# Patient Record
Sex: Female | Born: 1985 | Hispanic: Yes | Marital: Married | Smoking: Current some day smoker
Health system: Southern US, Community
[De-identification: ages and names within clinical notes are randomized; demographics above are authoritative.]

## PROBLEM LIST (undated history)

## (undated) ENCOUNTER — Inpatient Hospital Stay (HOSPITAL_COMMUNITY): Payer: Self-pay

## (undated) DIAGNOSIS — O24419 Gestational diabetes mellitus in pregnancy, unspecified control: Secondary | ICD-10-CM

## (undated) DIAGNOSIS — D649 Anemia, unspecified: Secondary | ICD-10-CM

## (undated) DIAGNOSIS — F419 Anxiety disorder, unspecified: Secondary | ICD-10-CM

## (undated) HISTORY — DX: Anemia, unspecified: D64.9

## (undated) HISTORY — PX: APPENDECTOMY: SHX54

---

## 2007-03-14 ENCOUNTER — Emergency Department: Payer: Self-pay | Admitting: Emergency Medicine

## 2011-02-06 ENCOUNTER — Emergency Department (HOSPITAL_COMMUNITY)
Admission: EM | Admit: 2011-02-06 | Discharge: 2011-02-06 | Disposition: A | Discharge status: Home / Self Care | Payer: Self-pay | Attending: Emergency Medicine | Admitting: Emergency Medicine

## 2011-02-06 ENCOUNTER — Emergency Department (HOSPITAL_COMMUNITY): Payer: Self-pay

## 2011-02-06 DIAGNOSIS — R51 Headache: Secondary | ICD-10-CM | POA: Insufficient documentation

## 2011-03-10 ENCOUNTER — Emergency Department (HOSPITAL_COMMUNITY)
Admission: EM | Admit: 2011-03-10 | Discharge: 2011-03-11 | Disposition: A | Discharge status: Home / Self Care | Payer: Self-pay | Attending: Emergency Medicine | Admitting: Emergency Medicine

## 2011-03-10 DIAGNOSIS — M545 Low back pain, unspecified: Secondary | ICD-10-CM | POA: Insufficient documentation

## 2011-03-10 DIAGNOSIS — L089 Local infection of the skin and subcutaneous tissue, unspecified: Secondary | ICD-10-CM | POA: Insufficient documentation

## 2011-03-10 DIAGNOSIS — G8929 Other chronic pain: Secondary | ICD-10-CM | POA: Insufficient documentation

## 2011-06-25 LAB — CBC AND DIFFERENTIAL
HCT: 40 % (ref 36–46)
Hemoglobin: 13.6 g/dL (ref 12.0–16.0)
Platelets: 253 10*3/uL (ref 150–399)
WBC: 5.7 10^3/mL

## 2011-06-25 LAB — TSH: TSH: 0.67 u[IU]/mL (ref 0.41–5.90)

## 2011-07-30 ENCOUNTER — Ambulatory Visit: Payer: Self-pay | Admitting: Internal Medicine

## 2011-07-30 DIAGNOSIS — Z0289 Encounter for other administrative examinations: Secondary | ICD-10-CM

## 2011-08-17 ENCOUNTER — Encounter (HOSPITAL_COMMUNITY): Payer: Self-pay | Admitting: Emergency Medicine

## 2011-08-17 ENCOUNTER — Emergency Department (HOSPITAL_COMMUNITY)
Admission: EM | Admit: 2011-08-17 | Discharge: 2011-08-17 | Disposition: A | Discharge status: Home / Self Care | Payer: Self-pay | Attending: Emergency Medicine | Admitting: Emergency Medicine

## 2011-08-17 DIAGNOSIS — IMO0001 Reserved for inherently not codable concepts without codable children: Secondary | ICD-10-CM | POA: Insufficient documentation

## 2011-08-17 DIAGNOSIS — G43909 Migraine, unspecified, not intractable, without status migrainosus: Secondary | ICD-10-CM | POA: Insufficient documentation

## 2011-08-17 DIAGNOSIS — R509 Fever, unspecified: Secondary | ICD-10-CM | POA: Insufficient documentation

## 2011-08-17 DIAGNOSIS — R070 Pain in throat: Secondary | ICD-10-CM | POA: Insufficient documentation

## 2011-08-17 DIAGNOSIS — R51 Headache: Secondary | ICD-10-CM | POA: Insufficient documentation

## 2011-08-17 DIAGNOSIS — F419 Anxiety disorder, unspecified: Secondary | ICD-10-CM | POA: Insufficient documentation

## 2011-08-17 HISTORY — DX: Anxiety disorder, unspecified: F41.9

## 2011-08-17 NOTE — ED Notes (Signed)
Pt c/o body aches, HA, fever, sore throat x several days

## 2011-08-17 NOTE — ED Notes (Signed)
Name called no answer 

## 2011-08-28 ENCOUNTER — Encounter: Payer: Self-pay | Admitting: Internal Medicine

## 2012-05-02 LAB — OB RESULTS CONSOLE ABO/RH: RH Type: POSITIVE

## 2012-05-02 LAB — OB RESULTS CONSOLE RPR: RPR: NONREACTIVE

## 2012-05-02 LAB — OB RESULTS CONSOLE HGB/HCT, BLOOD: Hemoglobin: 12.3 g/dL

## 2012-05-02 LAB — OB RESULTS CONSOLE GC/CHLAMYDIA
Chlamydia: NEGATIVE
Gonorrhea: NEGATIVE

## 2012-05-02 LAB — OB RESULTS CONSOLE HIV ANTIBODY (ROUTINE TESTING): HIV: NONREACTIVE

## 2012-06-10 ENCOUNTER — Ambulatory Visit: Payer: BC Managed Care – PPO | Admitting: Obstetrics and Gynecology

## 2012-06-10 DIAGNOSIS — Z3689 Encounter for other specified antenatal screening: Secondary | ICD-10-CM

## 2012-06-14 NOTE — Progress Notes (Signed)
NOB interview completed.  Pt transferred care from [place], [place], records have been received.  Scheduled an anatomy scan and NOB work up on Thursday 06/23/12, work up w/ AVS.

## 2012-06-23 ENCOUNTER — Encounter: Payer: Self-pay | Admitting: Obstetrics and Gynecology

## 2012-06-23 ENCOUNTER — Ambulatory Visit: Payer: BC Managed Care – PPO

## 2012-06-23 ENCOUNTER — Ambulatory Visit: Payer: BC Managed Care – PPO | Admitting: Obstetrics and Gynecology

## 2012-06-23 VITALS — BP 100/60 | Wt 140.0 lb

## 2012-06-23 DIAGNOSIS — Z331 Pregnant state, incidental: Secondary | ICD-10-CM | POA: Insufficient documentation

## 2012-06-23 DIAGNOSIS — R760 Raised antibody titer: Secondary | ICD-10-CM | POA: Insufficient documentation

## 2012-06-23 DIAGNOSIS — Z3689 Encounter for other specified antenatal screening: Secondary | ICD-10-CM

## 2012-06-23 DIAGNOSIS — O26849 Uterine size-date discrepancy, unspecified trimester: Secondary | ICD-10-CM

## 2012-06-23 DIAGNOSIS — Z3490 Encounter for supervision of normal pregnancy, unspecified, unspecified trimester: Secondary | ICD-10-CM

## 2012-06-23 LAB — POCT URINALYSIS DIPSTICK
Bilirubin, UA: NEGATIVE
Blood, UA: NEGATIVE
Glucose, UA: NEGATIVE
Ketones, UA: NEGATIVE
Leukocytes, UA: NEGATIVE
Nitrite, UA: NEGATIVE
Spec Grav, UA: 1.02
Urobilinogen, UA: NEGATIVE
pH, UA: 5

## 2012-06-23 NOTE — Progress Notes (Signed)
[facility] NEW OB EXAMINATION   Ashley Curtis is a 27 y.o. female, G1P0, who presents at [redacted]w[redacted]d gestation for a new obstetrical examination. The patient began her prenatal care in [place], [place]. I reviewed her old records.  The patient says that her last menstrual period was normal, but she has a documented history of irregular menstrual cycles.  Review of her old records show that the patient had a weakly positive antibody screen. It was suggested that we repeat her testing.  The following portions of the patient's history were reviewed and updated as appropriate: allergies, current medications, past family history, past medical history, past social history, past surgical history and problem list.  OB History    Grav Para Term Preterm Abortions TAB SAB Ect Mult Living   1               Past Medical History  Diagnosis Date  . Anxiety   . Migraine   . Anemia     Past Surgical History  Procedure Date  . Appendectomy     6 yoa    Family History  Problem Relation Age of Onset  . Gestational diabetes Mother   . Seizures Brother     Social History:  reports that she quit smoking about 2 months ago. She has never used smokeless tobacco. She reports that she does not drink alcohol or use illicit drugs.  Allergies: No Known Allergies  Medications: Xanax, prenatal vitamins, Percocet, Phenergan, and Tylenol.   Objective:    BP 100/60  Wt 140 lb (63.504 kg)  LMP 02/07/2012    Weight:  Wt Readings from Last 1 Encounters:  06/23/12 140 lb (63.504 kg)          BMI: There is no height on file to calculate BMI.  General Appearance: Alert, appropriate appearance for age. No acute distress HEENT: Grossly normal Neck / Thyroid: Supple, no masses, nodes or enlargement Lungs: clear to auscultation bilaterally Back: No CVA tenderness Breast Exam: No masses or nodes.No dimpling, nipple retraction or discharge. Cardiovascular: Regular rate and rhythm. S1, S2, no  murmur Gastrointestinal: Soft, non-tender, no masses or organomegaly.                               Fundal height: 17 weeks                               Fetal heart tones audible: yes, by ultrasound  ++++++++++++++++++++++++++++++++++++++++++++++++++++++++  Pelvic Exam: External genitalia: normal general appearance Vaginal: normal without tenderness, induration or masses and relaxation: No. Very anxious. Cervix: nontender Adnexa: normal bimanual exam Uterus: gravid, nontender, 17 weeks size  ++++++++++++++++++++++++++++++++++++++++++++++++++++++++  Lymphatic Exam: Non-palpable nodes in neck, clavicular, axillary, or inguinal regions Neurologic: Normal speech, no tremor  Psychiatric: Alert and oriented, appropriate affect.  Prenatal labs: ABO, Rh: B/Positive/-- (12/09 0000) Antibody: Positive (12/09 0000) Rubella:  immune nonreactive RPR: Nonreactive (12/09 0000)  HBsAg:   nonreactive HIV: Non-reactive (12/09 0000)  GBS:   pending until the third trimester Hemoglobin equals 12.2. Platelets equal 325,000 Gonorrhea: Negative Chlamydia: Negative Varicella-zoster: Immune Pap smear: Normal cells Urine culture: Negative  Wet Prep:   Previously done:            no                     If no: Whiff:  Negative                              Clue cells:             no                              PH:                        4.5                              Yeast:                    no                              Trichomoniasis:    no  Urine analysis:     N/A  Ultrasound today: Single gestation, normal fluid, normal anatomy but not all anatomy seen, female, 16 weeks and 4 days, cervix 3.78 cm.    Assessment:   27 y.o. female G1P0 at [redacted]w[redacted]d gestation ( EDC is November 13, 2012) by: Ultrasound today. Last menstrual cycle is not reliable because of a history of irregular periods.  Weakly positive antibody screen  Anxiety   Plan:    We discussed routine pregnancy  issues:  Toxoplasmosis was reviewed.  The patient was told to avoid cat liter boxes and feces.  The patient was told to avoid predator fish including tuna because of our concerns for mercury consumption.  The patient was told to avoid soft cheeses.  The patient was told to be sure that all lunch meats are well cooked.  Genetic screening was discussed. Review of neck that visit.  Our model for pregnancy management was reviewed.  Proper diet and exercise reviewed.  Return to office in 4 weeks.  Medications include:  Prenatal vitamins  Repeat ultrasound next visit to complete anatomy scan.  Repeat antibody screen next visit.  Mylinda Latina.D.

## 2012-06-23 NOTE — Progress Notes (Signed)
[redacted]w[redacted]d LMP 02/06/2012 Last Pap smear 05/2012.

## 2012-07-11 LAB — US OB COMP + 14 WK

## 2012-07-21 ENCOUNTER — Other Ambulatory Visit: Payer: BC Managed Care – PPO

## 2012-07-21 ENCOUNTER — Encounter: Payer: BC Managed Care – PPO | Admitting: Obstetrics and Gynecology

## 2012-07-22 ENCOUNTER — Other Ambulatory Visit: Payer: Self-pay | Admitting: Obstetrics and Gynecology

## 2012-07-22 ENCOUNTER — Encounter: Payer: Self-pay | Admitting: Obstetrics and Gynecology

## 2012-07-22 ENCOUNTER — Ambulatory Visit: Payer: BC Managed Care – PPO

## 2012-07-22 ENCOUNTER — Ambulatory Visit: Payer: BC Managed Care – PPO | Admitting: Obstetrics and Gynecology

## 2012-07-22 VITALS — BP 100/60 | Wt 150.0 lb

## 2012-07-22 DIAGNOSIS — O358XX0 Maternal care for other (suspected) fetal abnormality and damage, not applicable or unspecified: Secondary | ICD-10-CM

## 2012-07-22 DIAGNOSIS — Z331 Pregnant state, incidental: Secondary | ICD-10-CM

## 2012-07-22 DIAGNOSIS — Z3689 Encounter for other specified antenatal screening: Secondary | ICD-10-CM

## 2012-07-22 NOTE — Progress Notes (Signed)
[redacted]w[redacted]d [place] for anatomy  422 grams 56% Breech presentation Posterior placenta FLPK 5.0 cm Normal anatomy Positive antibody screen no ID or titer. Will redraw today QUAd screen today per pt request

## 2012-07-22 NOTE — Progress Notes (Signed)
Pt declines flu shot at this time.

## 2012-07-22 NOTE — Addendum Note (Signed)
Addended by: Rolla Plate on: 07/22/2012 02:22 PM   Modules accepted: Orders

## 2012-07-25 LAB — US OB FOLLOW UP

## 2012-07-25 LAB — AFP, QUAD SCREEN
Curr Gest Age: 20.5 wks.days
Down Syndrome Scr Risk Est: <1:38500 {titer}
INH: 137.1 pg/mL
Interpretation-AFP: NEGATIVE
MoM for INH: 0.74
MoM for hCG: 1.48
Osb Risk: 1:3270 {titer}
Tri 18 Scr Risk Est: NEGATIVE
Trisomy 18 (Edward) Syndrome Interp.: 1:141000 {titer}
uE3 Mom: 1.16
uE3 Value: 1.7 ng/mL

## 2012-08-19 ENCOUNTER — Other Ambulatory Visit: Payer: Self-pay | Admitting: Family Medicine

## 2012-08-20 LAB — ANTIBODY SCREEN: Antibody Screen: NEGATIVE

## 2012-11-02 ENCOUNTER — Encounter (HOSPITAL_COMMUNITY): Payer: Self-pay | Admitting: *Deleted

## 2012-11-02 ENCOUNTER — Inpatient Hospital Stay (HOSPITAL_COMMUNITY)
Admission: AD | Admit: 2012-11-02 | Discharge: 2012-11-02 | Disposition: A | Discharge status: Home / Self Care | Payer: Self-pay | Source: Ambulatory Visit | Attending: Obstetrics and Gynecology | Admitting: Obstetrics and Gynecology

## 2012-11-02 DIAGNOSIS — R109 Unspecified abdominal pain: Secondary | ICD-10-CM | POA: Insufficient documentation

## 2012-11-02 DIAGNOSIS — B9689 Other specified bacterial agents as the cause of diseases classified elsewhere: Secondary | ICD-10-CM | POA: Insufficient documentation

## 2012-11-02 DIAGNOSIS — N760 Acute vaginitis: Secondary | ICD-10-CM | POA: Insufficient documentation

## 2012-11-02 DIAGNOSIS — O2390 Unspecified genitourinary tract infection in pregnancy, unspecified trimester: Secondary | ICD-10-CM | POA: Insufficient documentation

## 2012-11-02 DIAGNOSIS — A499 Bacterial infection, unspecified: Secondary | ICD-10-CM | POA: Insufficient documentation

## 2012-11-02 LAB — URINE MICROSCOPIC-ADD ON

## 2012-11-02 LAB — URINALYSIS, ROUTINE W REFLEX MICROSCOPIC
Glucose, UA: NEGATIVE mg/dL
Nitrite: NEGATIVE
Specific Gravity, Urine: <1.005 — ABNORMAL LOW (ref 1.005–1.030)
pH: 6.5 (ref 5.0–8.0)

## 2012-11-02 LAB — AMNISURE RUPTURE OF MEMBRANE (ROM) NOT AT ARMC: Amnisure ROM: NEGATIVE

## 2012-11-02 LAB — WET PREP, GENITAL: Trich, Wet Prep: NONE SEEN

## 2012-11-02 MED ORDER — METRONIDAZOLE 500 MG PO TABS: 500.0000 mg | ORAL_TABLET | Freq: Three times a day (TID) | ORAL | Status: DC

## 2012-11-02 NOTE — MAU Note (Signed)
Pt presents with complaints of abdominal cramping, states it feels like her period cramps. States baby is active. Denies any bleeding or LOF

## 2012-11-15 ENCOUNTER — Encounter (HOSPITAL_COMMUNITY): Payer: Self-pay | Admitting: *Deleted

## 2012-11-15 ENCOUNTER — Inpatient Hospital Stay (HOSPITAL_COMMUNITY)
Admission: AD | Admit: 2012-11-15 | Discharge: 2012-11-15 | Disposition: A | Discharge status: Home / Self Care | Payer: BC Managed Care – PPO | Source: Ambulatory Visit | Attending: Obstetrics and Gynecology | Admitting: Obstetrics and Gynecology

## 2012-11-15 DIAGNOSIS — O26853 Spotting complicating pregnancy, third trimester: Secondary | ICD-10-CM

## 2012-11-15 DIAGNOSIS — O479 False labor, unspecified: Secondary | ICD-10-CM

## 2012-11-15 NOTE — MAU Note (Signed)
Pt states she has been having contractions for about 3 wks , pt states she is also feeling sharp pains

## 2012-11-15 NOTE — MAU Note (Signed)
PT SAYS AT 0330- SHE WENT TO B-ROOM- AND SAW REDDISH- ON TOILET PAPER-  NONE ON UNDERWEAR.     THEN SHE WENT  AGAIN AT  10 AM- AND SAW  LIGHT PINK SPOT ON TOILET PAPER.   FEELS CRAMPS-   LIKE CYCLE- - LAST NIGHT  AND  TODAY - SAME.      HERE IN MAU-  SAID VE - 50 % THINNER.     DENIES HSV AND MRSA.

## 2012-11-15 NOTE — MAU Note (Signed)
Pt states she has been having contractions

## 2012-12-01 ENCOUNTER — Encounter (HOSPITAL_COMMUNITY): Payer: Self-pay

## 2012-12-02 ENCOUNTER — Encounter (HOSPITAL_COMMUNITY): Payer: Self-pay

## 2012-12-02 ENCOUNTER — Encounter (HOSPITAL_COMMUNITY)
Admission: RE | Admit: 2012-12-02 | Discharge: 2012-12-02 | Disposition: A | Discharge status: Home / Self Care | Payer: BC Managed Care – PPO | Source: Ambulatory Visit | Attending: Obstetrics and Gynecology | Admitting: Obstetrics and Gynecology

## 2012-12-02 ENCOUNTER — Other Ambulatory Visit: Payer: Self-pay | Admitting: Obstetrics and Gynecology

## 2012-12-02 LAB — CBC
MCH: 23.5 pg — ABNORMAL LOW (ref 26.0–34.0)
MCHC: 31.8 g/dL (ref 30.0–36.0)
Platelets: 274 10*3/uL (ref 150–400)
RDW: 14.8 % (ref 11.5–15.5)

## 2012-12-02 NOTE — Patient Instructions (Addendum)
19 Ashley Curtis  12/02/2012   Your procedure is scheduled on:  12/05/12  Enter through the Main Entrance of [facility] at 745 AM.  Pick up the phone at the desk and dial [phone].   Call this number if you have problems the morning of surgery: [phone]   Remember:   Do not eat food:After Midnight.  Do not drink clear liquids: After Midnight.  Take these medicines the morning of surgery with A SIP OF WATER: Zantac   Do not wear jewelry, make-up or nail polish.  Do not wear lotions, powders, or perfumes. You may wear deodorant.  Do not shave 48 hours prior to surgery.  Do not bring valuables to the hospital.  [facility] is not responsible                  for any belongings or valuables brought to the hospital.  Contacts, dentures or bridgework may not be worn into surgery.  Leave suitcase in the car. After surgery it may be brought to your room.  For patients admitted to the hospital, checkout time is 11:00 AM the day of                discharge.   Patients discharged the day of surgery will not be allowed to drive                   home.  Name and phone number of your driver: NA  Special Instructions: Shower using CHG 2 nights before surgery and the night before surgery.  If you shower the day of surgery use CHG.  Use special wash - you have one bottle of CHG for all showers.  You should use approximately 1/3 of the bottle for each shower.   Please read over the following fact sheets that you were given: Surgical Site Infection Prevention

## 2012-12-03 ENCOUNTER — Encounter (HOSPITAL_COMMUNITY): Payer: Self-pay | Admitting: Pharmacy Technician

## 2012-12-03 LAB — RPR: RPR Ser Ql: NONREACTIVE

## 2012-12-04 ENCOUNTER — Encounter (HOSPITAL_COMMUNITY): Payer: Self-pay | Admitting: *Deleted

## 2012-12-04 ENCOUNTER — Encounter (HOSPITAL_COMMUNITY)
Admission: AD | Disposition: A | Discharge status: Home / Self Care | Payer: Self-pay | Source: Ambulatory Visit | Attending: Obstetrics and Gynecology

## 2012-12-04 ENCOUNTER — Encounter (HOSPITAL_COMMUNITY): Payer: Self-pay | Admitting: Anesthesiology

## 2012-12-04 ENCOUNTER — Inpatient Hospital Stay (HOSPITAL_COMMUNITY)
Admission: AD | Admit: 2012-12-04 | Discharge: 2012-12-06 | DRG: 370 | Disposition: A | Discharge status: Home / Self Care | Payer: BC Managed Care – PPO | Source: Ambulatory Visit | Attending: Obstetrics and Gynecology | Admitting: Obstetrics and Gynecology

## 2012-12-04 ENCOUNTER — Inpatient Hospital Stay (HOSPITAL_COMMUNITY): Payer: BC Managed Care – PPO | Admitting: Anesthesiology

## 2012-12-04 DIAGNOSIS — O24419 Gestational diabetes mellitus in pregnancy, unspecified control: Secondary | ICD-10-CM

## 2012-12-04 DIAGNOSIS — O3660X0 Maternal care for excessive fetal growth, unspecified trimester, not applicable or unspecified: Principal | ICD-10-CM | POA: Diagnosis present

## 2012-12-04 DIAGNOSIS — D649 Anemia, unspecified: Secondary | ICD-10-CM | POA: Diagnosis not present

## 2012-12-04 DIAGNOSIS — Z98891 History of uterine scar from previous surgery: Secondary | ICD-10-CM

## 2012-12-04 DIAGNOSIS — O9903 Anemia complicating the puerperium: Secondary | ICD-10-CM | POA: Diagnosis not present

## 2012-12-04 LAB — HEPATITIS B SURFACE ANTIGEN: Hepatitis B Surface Ag: NEGATIVE

## 2012-12-04 SURGERY — Surgical Case
Anesthesia: Spinal | Site: Abdomen | Wound class: Clean Contaminated

## 2012-12-04 MED ORDER — KETOROLAC TROMETHAMINE 60 MG/2ML IM SOLN
INTRAMUSCULAR | Status: AC
  Administered 2012-12-04: 60 mg via INTRAMUSCULAR
  Filled 2012-12-04: qty 2

## 2012-12-04 MED ORDER — PROMETHAZINE HCL 25 MG/ML IJ SOLN
6.2500 mg | INTRAMUSCULAR | Status: DC | PRN
Start: 2012-12-04 — End: 2012-12-05

## 2012-12-04 MED ORDER — ONDANSETRON HCL 4 MG/2ML IJ SOLN
INTRAMUSCULAR | Status: AC
  Filled 2012-12-04: qty 2

## 2012-12-04 MED ORDER — FENTANYL CITRATE 0.05 MG/ML IJ SOLN
INTRAMUSCULAR | Status: AC
  Filled 2012-12-04: qty 2

## 2012-12-04 MED ORDER — PROMETHAZINE HCL 25 MG/ML IJ SOLN
INTRAMUSCULAR | Status: AC
  Administered 2012-12-04: 6.25 mg via INTRAVENOUS
  Filled 2012-12-04: qty 1

## 2012-12-04 MED ORDER — OXYTOCIN 10 UNIT/ML IJ SOLN
INTRAMUSCULAR | Status: AC
  Filled 2012-12-04: qty 4

## 2012-12-04 MED ORDER — MORPHINE SULFATE 0.5 MG/ML IJ SOLN
INTRAMUSCULAR | Status: AC
  Filled 2012-12-04: qty 10

## 2012-12-04 MED ORDER — SCOPOLAMINE 1 MG/3DAYS TD PT72: 1.0000 | MEDICATED_PATCH | Freq: Once | TRANSDERMAL | Status: DC

## 2012-12-04 MED ORDER — FAMOTIDINE IN NACL 20-0.9 MG/50ML-% IV SOLN
20.0000 mg | Freq: Once | INTRAVENOUS | Status: AC
  Administered 2012-12-04: 20 mg via INTRAVENOUS

## 2012-12-04 MED ORDER — FENTANYL CITRATE 0.05 MG/ML IJ SOLN
INTRAMUSCULAR | Status: DC | PRN
  Administered 2012-12-04: 12.5 ug via INTRATHECAL

## 2012-12-04 MED ORDER — LACTATED RINGERS IV SOLN
INTRAVENOUS | Status: DC | PRN
  Administered 2012-12-04: 22:00:00 via INTRAVENOUS

## 2012-12-04 MED ORDER — MORPHINE SULFATE (PF) 0.5 MG/ML IJ SOLN
INTRAMUSCULAR | Status: DC | PRN
  Administered 2012-12-04: .1 mg via EPIDURAL

## 2012-12-04 MED ORDER — MEPERIDINE HCL 25 MG/ML IJ SOLN: 6.2500 mg | INTRAMUSCULAR | Status: DC | PRN

## 2012-12-04 MED ORDER — KETOROLAC TROMETHAMINE 60 MG/2ML IM SOLN: 60.0000 mg | Freq: Once | INTRAMUSCULAR | Status: AC | PRN

## 2012-12-04 MED ORDER — FENTANYL CITRATE 0.05 MG/ML IJ SOLN: 25.0000 ug | INTRAMUSCULAR | Status: DC | PRN

## 2012-12-04 MED ORDER — SCOPOLAMINE 1 MG/3DAYS TD PT72
MEDICATED_PATCH | TRANSDERMAL | Status: AC
  Administered 2012-12-04: 1.5 mg via TRANSDERMAL
  Filled 2012-12-04: qty 1

## 2012-12-04 MED ORDER — CITRIC ACID-SODIUM CITRATE 334-500 MG/5ML PO SOLN
ORAL | Status: AC
  Filled 2012-12-04: qty 15

## 2012-12-04 MED ORDER — BUPIVACAINE HCL (PF) 0.25 % IJ SOLN
INTRAMUSCULAR | Status: AC
  Filled 2012-12-04: qty 30

## 2012-12-04 MED ORDER — CEFAZOLIN SODIUM-DEXTROSE 2-3 GM-% IV SOLR
2.0000 g | INTRAVENOUS | Status: AC
  Administered 2012-12-04: 2 g via INTRAVENOUS
  Filled 2012-12-04: qty 50

## 2012-12-04 MED ORDER — 0.9 % SODIUM CHLORIDE (POUR BTL) OPTIME
TOPICAL | Status: DC | PRN
  Administered 2012-12-04: 500 mL

## 2012-12-04 MED ORDER — BUPIVACAINE IN DEXTROSE 0.75-8.25 % IT SOLN
INTRATHECAL | Status: DC | PRN
  Administered 2012-12-04: 1.5 mg via INTRATHECAL

## 2012-12-04 MED ORDER — LACTATED RINGERS IV SOLN
INTRAVENOUS | Status: DC
  Administered 2012-12-04 (×4): via INTRAVENOUS

## 2012-12-04 MED ORDER — CITRIC ACID-SODIUM CITRATE 334-500 MG/5ML PO SOLN
30.0000 mL | Freq: Once | ORAL | Status: AC
  Administered 2012-12-04: 30 mL via ORAL

## 2012-12-04 MED ORDER — ONDANSETRON HCL 4 MG/2ML IJ SOLN
INTRAMUSCULAR | Status: DC | PRN
  Administered 2012-12-04: 4 mg via INTRAVENOUS

## 2012-12-04 MED ORDER — BUPIVACAINE HCL (PF) 0.25 % IJ SOLN
INTRAMUSCULAR | Status: DC | PRN
  Administered 2012-12-04: 20 mL

## 2012-12-04 MED ORDER — FAMOTIDINE IN NACL 20-0.9 MG/50ML-% IV SOLN
INTRAVENOUS | Status: AC
  Filled 2012-12-04: qty 50

## 2012-12-04 MED ORDER — LACTATED RINGERS IV SOLN: INTRAVENOUS | Status: DC

## 2012-12-04 SURGICAL SUPPLY — 35 items
BENZOIN TINCTURE PRP APPL 2/3 (GAUZE/BANDAGES/DRESSINGS) ×2 IMPLANT
BOOTIES KNEE HIGH SLOAN (MISCELLANEOUS) ×4 IMPLANT
CLAMP CORD UMBIL (MISCELLANEOUS) IMPLANT
CLOTH BEACON ORANGE TIMEOUT ST (SAFETY) ×2 IMPLANT
DRAIN JACKSON PRT FLT 10 (DRAIN) IMPLANT
DRAPE LG THREE QUARTER DISP (DRAPES) ×2 IMPLANT
DRSG OPSITE POSTOP 4X10 (GAUZE/BANDAGES/DRESSINGS) ×2 IMPLANT
DURAPREP 26ML APPLICATOR (WOUND CARE) ×2 IMPLANT
ELECT REM PT RETURN 9FT ADLT (ELECTROSURGICAL) ×2
ELECTRODE REM PT RTRN 9FT ADLT (ELECTROSURGICAL) ×1 IMPLANT
EVACUATOR SILICONE 100CC (DRAIN) IMPLANT
EXTRACTOR VACUUM M CUP 4 TUBE (SUCTIONS) IMPLANT
GLOVE BIOGEL PI IND STRL 7.0 (GLOVE) ×1 IMPLANT
GLOVE BIOGEL PI INDICATOR 7.0 (GLOVE) ×1
GLOVE ECLIPSE 6.5 STRL STRAW (GLOVE) ×2 IMPLANT
GLOVE SURG SS PI 7.5 STRL IVOR (GLOVE) ×8 IMPLANT
GOWN STRL REIN XL XLG (GOWN DISPOSABLE) ×10 IMPLANT
KIT ABG SYR 3ML LUER SLIP (SYRINGE) IMPLANT
NEEDLE HYPO 22GX1.5 SAFETY (NEEDLE) ×2 IMPLANT
NEEDLE HYPO 25X5/8 SAFETYGLIDE (NEEDLE) IMPLANT
NS IRRIG 1000ML POUR BTL (IV SOLUTION) ×4 IMPLANT
PACK C SECTION WH (CUSTOM PROCEDURE TRAY) ×2 IMPLANT
PAD OB MATERNITY 4.3X12.25 (PERSONAL CARE ITEMS) ×2 IMPLANT
RTRCTR C-SECT PINK 25CM LRG (MISCELLANEOUS) ×2 IMPLANT
STRIP CLOSURE SKIN 1/2X4 (GAUZE/BANDAGES/DRESSINGS) ×2 IMPLANT
SUT CHROMIC GUT AB #0 18 (SUTURE) IMPLANT
SUT MNCRL AB 3-0 PS2 27 (SUTURE) ×2 IMPLANT
SUT SILK 2 0 FSL 18 (SUTURE) IMPLANT
SUT VIC AB 0 CTX 36 (SUTURE) ×2
SUT VIC AB 0 CTX36XBRD ANBCTRL (SUTURE) ×2 IMPLANT
SUT VIC AB 1 CT1 36 (SUTURE) ×4 IMPLANT
SYR 20CC LL (SYRINGE) ×2 IMPLANT
TOWEL OR 17X24 6PK STRL BLUE (TOWEL DISPOSABLE) ×6 IMPLANT
TRAY FOLEY CATH 14FR (SET/KITS/TRAYS/PACK) ×2 IMPLANT
WATER STERILE IRR 1000ML POUR (IV SOLUTION) ×2 IMPLANT

## 2012-12-04 SURGICAL SUPPLY — 34 items
BENZOIN TINCTURE PRP APPL 2/3 (GAUZE/BANDAGES/DRESSINGS) IMPLANT
CLAMP CORD UMBIL (MISCELLANEOUS) IMPLANT
CLOTH BEACON ORANGE TIMEOUT ST (SAFETY) IMPLANT
CONTAINER PREFILL 10% NBF 15ML (MISCELLANEOUS) IMPLANT
DRAPE LG THREE QUARTER DISP (DRAPES) IMPLANT
DRSG OPSITE POSTOP 4X10 (GAUZE/BANDAGES/DRESSINGS) IMPLANT
DURAPREP 26ML APPLICATOR (WOUND CARE) IMPLANT
ELECT REM PT RETURN 9FT ADLT (ELECTROSURGICAL)
ELECTRODE REM PT RTRN 9FT ADLT (ELECTROSURGICAL) IMPLANT
EXTRACTOR VACUUM M CUP 4 TUBE (SUCTIONS) IMPLANT
GLOVE BIO SURGEON STRL SZ7.5 (GLOVE) IMPLANT
GLOVE BIOGEL PI IND STRL 7.5 (GLOVE) IMPLANT
GLOVE BIOGEL PI INDICATOR 7.5 (GLOVE)
GLOVE SURG SS PI 6.5 STRL IVOR (GLOVE) IMPLANT
GLOVE SURG SS PI 7.0 STRL IVOR (GLOVE) IMPLANT
GOWN STRL REIN XL XLG (GOWN DISPOSABLE) IMPLANT
KIT ABG SYR 3ML LUER SLIP (SYRINGE) IMPLANT
NEEDLE HYPO 25X5/8 SAFETYGLIDE (NEEDLE) IMPLANT
NS IRRIG 1000ML POUR BTL (IV SOLUTION) IMPLANT
PACK C SECTION WH (CUSTOM PROCEDURE TRAY) IMPLANT
PAD OB MATERNITY 4.3X12.25 (PERSONAL CARE ITEMS) IMPLANT
RETRACTOR WND ALEXIS 25 LRG (MISCELLANEOUS) IMPLANT
RTRCTR WOUND ALEXIS 25CM LRG (MISCELLANEOUS)
STRIP CLOSURE SKIN 1/2X4 (GAUZE/BANDAGES/DRESSINGS) IMPLANT
SUT CHROMIC 2 0 CT 1 (SUTURE) IMPLANT
SUT MNCRL AB 3-0 PS2 27 (SUTURE) IMPLANT
SUT PLAIN 0 NONE (SUTURE) IMPLANT
SUT PLAIN 2 0 XLH (SUTURE) IMPLANT
SUT VIC AB 0 CT1 36 (SUTURE) IMPLANT
SUT VIC AB 0 CTX 36 (SUTURE)
SUT VIC AB 0 CTX36XBRD ANBCTRL (SUTURE) IMPLANT
TOWEL OR 17X24 6PK STRL BLUE (TOWEL DISPOSABLE) IMPLANT
TRAY FOLEY CATH 14FR (SET/KITS/TRAYS/PACK) IMPLANT
WATER STERILE IRR 1000ML POUR (IV SOLUTION) IMPLANT

## 2012-12-04 NOTE — Anesthesia Procedure Notes (Signed)
Spinal  Patient location during procedure: OR Staffing Anesthesiologist: Phillips Grout Performed by: anesthesiologist  Preanesthetic Checklist Completed: patient identified, site marked, surgical consent, pre-op evaluation, timeout performed, IV checked, risks and benefits discussed and monitors and equipment checked Spinal Block Patient position: sitting Prep: ChloraPrep Patient monitoring: heart rate, continuous pulse ox and blood pressure Approach: midline Location: L3-4 Injection technique: single-shot Needle Needle type: Spinocan  Needle gauge: 22 G Needle length: 9 cm Additional Notes Expiration date of kit checked and confirmed. Patient tolerated procedure well, without complications.

## 2012-12-04 NOTE — Anesthesia Preprocedure Evaluation (Signed)
Anesthesia Evaluation  Patient identified by MRN, date of birth, ID band Patient awake    Reviewed: Allergy & Precautions, H&P , NPO status , Patient's Chart, lab work & pertinent test results  Airway Mallampati: II TM Distance: >3 FB Neck ROM: Full    Dental no notable dental hx.    Pulmonary neg pulmonary ROS,  breath sounds clear to auscultation  Pulmonary exam normal       Cardiovascular negative cardio ROS  Rhythm:Regular Rate:Normal     Neuro/Psych negative neurological ROS  negative psych ROS   GI/Hepatic negative GI ROS, Neg liver ROS,   Endo/Other  negative endocrine ROS  Renal/GU negative Renal ROS  negative genitourinary   Musculoskeletal negative musculoskeletal ROS (+)   Abdominal   Peds negative pediatric ROS (+)  Hematology negative hematology ROS (+)   Anesthesia Other Findings   Reproductive/Obstetrics negative OB ROS (+) Pregnancy                           Anesthesia Physical Anesthesia Plan  ASA: II and emergent  Anesthesia Plan: Spinal   Post-op Pain Management:    Induction:   Airway Management Planned:   Additional Equipment:   Intra-op Plan:   Post-operative Plan:   Informed Consent: I have reviewed the patients History and Physical, chart, labs and discussed the procedure including the risks, benefits and alternatives for the proposed anesthesia with the patient or authorized representative who has indicated his/her understanding and acceptance.   Dental advisory given  Plan Discussed with: CRNA  Anesthesia Plan Comments:         Anesthesia Quick Evaluation  

## 2012-12-04 NOTE — Op Note (Signed)
Preoperative diagnosis: Intrauterine pregnancy at 40 weeks with suspected macrosomia. Patient desires elective cesarean section  Post operative diagnosis: Same  Anesthesia: Spinal  Anesthesiologist: Dr. Willa Frater  Procedure: Primary low transverse cesarean section  Surgeon: Dr. Dois Davenport Javon Snee  Assistant: Sanda Klein CNM  Estimated blood loss:600 cc  Procedure:  After being informed of the planned procedure and possible complications including bleeding, infection, injury to other organs, informed consent is obtained. The patient is taken to OR #9 and given spinal anesthesia without complication. She is placed in the dorsal decubitus position with the pelvis tilted to the left. She is then prepped and draped in a sterile fashion. A Foley catheter is inserted in her bladder.  After assessing adequate level of anesthesia, we infiltrate the suprapubic area with 20 cc of Marcaine 0.25 and perform a Pfannenstiel incision which is brought down sharply to the fascia. The fascia is entered in a low transverse fashion. Linea alba is dissected. Peritoneum is entered in a midline fashion. An Alexis retractor is easily positioned.   The myometrium is then entered in a low transverse fashion, 2 cm above the vesico-uterine junction ; first with knife and then extended bluntly. Amniotic fluid is clear. We assist the birth of a female  infant in vertex presentation. Mouth and nose are suctioned. The baby is delivered. The cord is clamped and sectioned. The baby is given to the neonatologist present in the room.  10 cc of blood is drawn from the umbilical vein.The placenta is allowed to deliver spontaneously. It is complete and the cord has 3 vessels. Uterine revision is negative.  We proceed with closure of the myometrium in 2 layers: First with a running locked suture of 0 Vicryl, then with a Lembert suture of 0 Vicryl imbricating the first one. Hemostasis is completed with cauterization on peritoneal  edges.  Both paracolic gutters are cleaned. Both tubes and ovaries are assessed and normal. The pelvis is profusely irrigated with warm saline to confirm a satisfactory hemostasis.  Retractors and sponges are removed. Under fascia hemostasis is completed with cauterization. The fascia is then closed with 2 running sutures of 0 Vicryl meeting midline. The wound is irrigated with warm saline and hemostasis is completed with cauterization. The skin is closed with a subcuticular suture of 3-0 Monocryl and Steri-Strips.  Instrument and sponge count is complete x2. Estimated blood loss is 600 cc.  The procedure is well tolerated by the patient who is taken to recovery room in a well and stable condition.  female baby named [name] was born at 21:43 and received an Apgar of 9  at 1 minute and 9 at 5 minutes.    Specimen: Placenta sent to L & D   Ciro Tashiro A MD 7/13/201410:17 PM

## 2012-12-04 NOTE — MAU Provider Note (Signed)
History   CSN: [id number]  Arrival date and time: 11/02/12 4098    Chief Complaint  Patient presents with  . Abdominal Pain  . Vaginal Discharge   HPI Pt presents with c/o intermittent cramping, abdominal pain and increased vaginal discharge.  She reports normal fetal movement.  She denies itching, burning but has noted some odor with vaginal discharge.  Reports cramping approx 1-2x/hr.  Denies bldg.   OB History   Grav Para Term Preterm Abortions TAB SAB Ect Mult Living   1               Past Medical History  Diagnosis Date  . Anxiety   . Migraine   . Anemia     Past Surgical History  Procedure Laterality Date  . Appendectomy      6 yoa    Family History  Problem Relation Age of Onset  . Gestational diabetes Mother   . Seizures Brother     History  Substance Use Topics  . Smoking status: Former Smoker    Quit date: 03/25/2012  . Smokeless tobacco: Never Used  . Alcohol Use: No     Comment: Occasionally prior to pregnancy    Allergies: No Known Allergies  No prescriptions prior to admission    Review of Systems  Constitutional: Negative.   HENT: Negative.   Eyes: Negative.   Respiratory: Negative.   Cardiovascular: Negative.   Gastrointestinal: Negative.   Genitourinary: Negative.   Musculoskeletal: Negative.   Skin: Negative.   Neurological: Negative.   Endo/Heme/Allergies: Negative.   Psychiatric/Behavioral: Negative.    Physical Exam   Blood pressure 119/77, pulse 79, temperature 97.6 F (36.4 C), temperature source Oral, resp. rate 18, height 5\' 5"  (1.651 m), weight 81.194 kg (179 lb), last menstrual period 02/07/2012.  Physical Exam  Constitutional: She is oriented to person, place, and time. She appears well-developed and well-nourished.  HENT:  Head: Normocephalic and atraumatic.  Right Ear: External ear normal.  Left Ear: External ear normal.  Nose: Nose normal.  Eyes: Conjunctivae are normal. Pupils are equal, round, and reactive  to light.  Neck: Normal range of motion. Neck supple.  Cardiovascular: Normal rate, regular rhythm and intact distal pulses.   Respiratory: Effort normal and breath sounds normal.  GI: Soft. Bowel sounds are normal. There is no tenderness. There is no rebound and no guarding.  Genitourinary: Uterus normal. Vaginal discharge found.  Ext gent WNL.  BUS neg.  Mod amt white creamy discharge in vault.  Cx without lesions or discharge.  Vault without redness.  SVE: Closed/thick/-3  Musculoskeletal: Normal range of motion.  Neurological: She is alert and oriented to person, place, and time. She has normal reflexes.  Skin: Skin is warm and dry.  Psychiatric: She has a normal mood and affect. Her behavior is normal.   FHR baseline: 130bpm; Variability-moderate; accels-present; decels-absent; FHR Cat 1 Toco: Irreg UCs   MAU Course  Procedures Microscopic wet-mount exam shows clue cells and WBCs Urine dipstick shows:  Ref. Range 11/02/2012 20:40  Color, Urine Latest Range: YELLOW  YELLOW  APPearance Latest Range: CLEAR  CLEAR  Specific Gravity, Urine Latest Range: 1.005-1.030  <1.005 (L)  pH Latest Range: 5.0-8.0  6.5  Glucose Latest Range: NEGATIVE mg/dL NEGATIVE  Bilirubin Urine Latest Range: NEGATIVE  NEGATIVE  Ketones, ur Latest Range: NEGATIVE mg/dL NEGATIVE  Protein Latest Range: NEGATIVE mg/dL NEGATIVE  Urobilinogen, UA Latest Range: 0.0-1.0 mg/dL 0.2  Nitrite Latest Range: NEGATIVE  NEGATIVE  Leukocytes, UA  Latest Range: NEGATIVE  TRACE (A)  Hgb urine dipstick Latest Range: NEGATIVE  SMALL (A)  WBC, UA Latest Range: <3 WBC/hpf 0-2  RBC / HPF Latest Range: <3 RBC/hpf 3-6  Squamous Epithelial / LPF Latest Range: RARE  RARE  Bacteria, UA Latest Range: RARE  FEW (A)    Assessment and Plan  IUP at 35w 3d Bacterial vaginosis  Discharge to home. Rx Metronidazole 500mg  bid x 7 days with instructions reviewed. Revd fetal kick counts/s/s labor. RTO as sched for f/u in 2 days.    Delorice Bannister O. 12/04/2012, 12:12 AM

## 2012-12-04 NOTE — Transfer of Care (Signed)
Immediate Anesthesia Transfer of Care Note  Patient: Ashley Curtis  Procedure(s) Performed: Procedure(s): CESAREAN SECTION (N/A)  Patient Location: PACU  Anesthesia Type:Spinal  Level of Consciousness: awake, alert  and oriented  Airway & Oxygen Therapy: Patient Spontanous Breathing  Post-op Assessment: Report given to PACU RN and Post -op Vital signs reviewed and stable  Post vital signs: Reviewed and stable  Complications: No apparent anesthesia complications

## 2012-12-04 NOTE — H&P (Signed)
Ashley Curtis is a 27 y.o. female at 78wks,  presenting for onset of ctx about 430 this morning, getting stronger and more frequent, denies any VB or LOF, GFM. Pt scheduled for primary elective c/s due to LGA tmrw AM. Pt still wishes to proceed w c/s.   HPI: Pt tx care from [place] to [facility] at 16wks.  [place] at that time was normal, w EDC 7/13, EDC by LMP =6/22, Final EDC 7/13 [place] at 20wks for anatomy WNL c/w 7/13 EDC Quad screen WNL Pt had a hx of +antibody screen, repeated at 24wks negative 1hr gtt 173, at 28wks, pt was unable to tolerate 3hr gtt,  Random CBG at office was 75 GBS neg at 36wks tx'd for BV on 6/11  [place] at 38wks EFW 90% 8#12oz [place] at [redacted]w[redacted]d EFW 8#12oz Pt was counseled on R/B of vag delivery vs. Primary c/s, pt desires primary c/s VE at 38wks cervix closed   Maternal Medical History:  Reason for admission: Contractions.   Contractions: Onset was 6-12 hours ago.   Frequency: regular.   Duration is approximately 60 seconds.   Perceived severity is mild.    Fetal activity: Perceived fetal activity is normal.   Last perceived fetal movement was within the past hour.    Prenatal complications: no prenatal complications Prenatal Complications - Diabetes: Elevated 1hr gtt, no further testing   OB History   Grav Para Term Preterm Abortions TAB SAB Ect Mult Living   1              Past Medical History  Diagnosis Date  . Anxiety   . Migraine   . Anemia     Family History: family history includes Gestational diabetes in her mother and Seizures in her brother. Social History:  reports that she quit smoking about 8 months ago. She has never used smokeless tobacco. She reports that she does not drink alcohol or use illicit drugs.   Prenatal Transfer Tool  Maternal Diabetes: unknown, abnormal 1hr gtt without further testing  Genetic Screening: Normal Maternal Ultrasounds/Referrals: Normal Fetal Ultrasounds or other Referrals:  None Maternal Substance Abuse:  No Significant  Maternal Medications:  None Significant Maternal Lab Results:  Lab values include: Group B Strep negative Other Comments:  None  Review of Systems  All other systems reviewed and are negative.    Dilation: 1 Effacement (%): 50 Exam by:: Shelly Renuka Farfan CNM Blood pressure 127/79, pulse 86, temperature 97.8 F (36.6 C), temperature source Oral, resp. rate 16, height 5\' 5"  (1.651 m), weight 180 lb (81.647 kg), last menstrual period 02/07/2012. Maternal Exam:  Uterine Assessment: Contraction strength is mild.  Contraction duration is 60 seconds. Contraction frequency is regular.   Abdomen: Patient reports no abdominal tenderness. Fundal height is aga.   Estimated fetal weight is 8-12.   Fetal presentation: vertex  Introitus: Normal vulva. Normal vagina.  Pelvis: adequate for delivery.   Cervix: Cervix evaluated by digital exam.     Fetal Exam Fetal Monitor Review: Mode: ultrasound.   Baseline rate: 130.  Variability: moderate (6-25 bpm).   Pattern: accelerations present and no decelerations.    Fetal State Assessment: Category I - tracings are normal.     Physical Exam  Nursing note and vitals reviewed. Constitutional: She is oriented to person, place, and time. She appears well-developed and well-nourished.  HENT:  Head: Normocephalic.  Eyes: Pupils are equal, round, and reactive to light.  Neck: Normal range of motion.  Cardiovascular: Normal rate, regular rhythm and  normal heart sounds.   Respiratory: Effort normal and breath sounds normal.  GI: Soft. Bowel sounds are normal.  Genitourinary: Vagina normal.  Cervix initially 1/50/-1, posterior Changed to 1.5/80/-1/0, midline  Musculoskeletal: Normal range of motion.  Neurological: She is alert and oriented to person, place, and time. She has normal reflexes.  Skin: Skin is warm and dry.  Psychiatric: She has a normal mood and affect. Her behavior is normal.    Prenatal labs: ABO, Rh: --/--/B POS, B POS (07/11  1615) Antibody: NEG (07/11 1615) Rubella: Immune (12/09 0000) RPR: NON REACTIVE (07/11 1615)  HBsAg:   unavailable at time of admission HIV: Non-reactive (12/09 0000)  GBS:   neg 6/26 Pap/GC/CT neg - 1/14 TSH .67 1/31 Varicella Imm AB screen pos, repeat neg on 3/28 Quad screen WNL 1hr gtt 173, hgb 9.1, RPR NR 4/25   Assessment/Plan: IUP at 40wks Abnormal 1hr gtt,  LGA Early labor GBS neg FHR reassuring  Admit to preop per c/w Dr Su Hilt Routine pre-op orders Ancef 2g in the OR Type and cross for 2 units Consent for primary elective c/s, risks of c/s include 1. Bleeding 2. Infection 3. Anesthesia complications 4. Damage to internal organs Pt understands these risks and wishes to proceed w C/S.     Malissa Hippo 12/04/2012, 6:04 PM

## 2012-12-04 NOTE — OR Nursing (Signed)
50 ml blood loss during fundal massage by DLWegner RN 

## 2012-12-04 NOTE — MAU Note (Signed)
Pt presents with complaints of contractions that started last night and have gotten worse throughout the day. Denies any bleeding or LOF. States that she is scheduled for a cesarean section tomorrow July 14th with Dr Su Hilt.

## 2012-12-05 ENCOUNTER — Encounter (HOSPITAL_COMMUNITY): Payer: Self-pay | Admitting: Obstetrics and Gynecology

## 2012-12-05 ENCOUNTER — Encounter (HOSPITAL_COMMUNITY): Admission: RE | Payer: Self-pay | Source: Ambulatory Visit

## 2012-12-05 ENCOUNTER — Inpatient Hospital Stay (HOSPITAL_COMMUNITY)
Admission: RE | Admit: 2012-12-05 | Payer: BC Managed Care – PPO | Source: Ambulatory Visit | Admitting: Obstetrics and Gynecology

## 2012-12-05 LAB — CBC
HCT: 21.7 % — ABNORMAL LOW (ref 36.0–46.0)
Hemoglobin: 6.9 g/dL — CL (ref 12.0–15.0)
MCH: 23.5 pg — ABNORMAL LOW (ref 26.0–34.0)
MCHC: 31.8 g/dL (ref 30.0–36.0)
MCV: 74.1 fL — ABNORMAL LOW (ref 78.0–100.0)
RDW: 14.9 % (ref 11.5–15.5)

## 2012-12-05 SURGERY — Surgical Case
Anesthesia: Regional

## 2012-12-05 MED ORDER — SIMETHICONE 80 MG PO CHEW
80.0000 mg | CHEWABLE_TABLET | Freq: Three times a day (TID) | ORAL | Status: DC
  Administered 2012-12-05 – 2012-12-06 (×5): 80 mg via ORAL

## 2012-12-05 MED ORDER — DIPHENHYDRAMINE HCL 50 MG/ML IJ SOLN: 12.5000 mg | INTRAMUSCULAR | Status: DC | PRN

## 2012-12-05 MED ORDER — WITCH HAZEL-GLYCERIN EX PADS: 1.0000 "application " | MEDICATED_PAD | CUTANEOUS | Status: DC | PRN

## 2012-12-05 MED ORDER — DIPHENHYDRAMINE HCL 50 MG/ML IJ SOLN: 25.0000 mg | INTRAMUSCULAR | Status: DC | PRN

## 2012-12-05 MED ORDER — IBUPROFEN 600 MG PO TABS
600.0000 mg | ORAL_TABLET | Freq: Four times a day (QID) | ORAL | Status: DC
  Administered 2012-12-05 – 2012-12-06 (×5): 600 mg via ORAL
  Filled 2012-12-05 (×5): qty 1

## 2012-12-05 MED ORDER — TETANUS-DIPHTH-ACELL PERTUSSIS 5-2.5-18.5 LF-MCG/0.5 IM SUSP: 0.5000 mL | Freq: Once | INTRAMUSCULAR | Status: DC

## 2012-12-05 MED ORDER — SENNOSIDES-DOCUSATE SODIUM 8.6-50 MG PO TABS
2.0000 | ORAL_TABLET | Freq: Every day | ORAL | Status: DC
  Administered 2012-12-05: 2 via ORAL

## 2012-12-05 MED ORDER — SODIUM CHLORIDE 0.9 % IJ SOLN: 3.0000 mL | INTRAMUSCULAR | Status: DC | PRN

## 2012-12-05 MED ORDER — KETOROLAC TROMETHAMINE 30 MG/ML IJ SOLN: 30.0000 mg | Freq: Four times a day (QID) | INTRAMUSCULAR | Status: AC | PRN

## 2012-12-05 MED ORDER — LANOLIN HYDROUS EX OINT: 1.0000 "application " | TOPICAL_OINTMENT | CUTANEOUS | Status: DC | PRN

## 2012-12-05 MED ORDER — METOCLOPRAMIDE HCL 5 MG/ML IJ SOLN: 10.0000 mg | Freq: Three times a day (TID) | INTRAMUSCULAR | Status: DC | PRN

## 2012-12-05 MED ORDER — NALOXONE HCL 0.4 MG/ML IJ SOLN: 0.4000 mg | INTRAMUSCULAR | Status: DC | PRN

## 2012-12-05 MED ORDER — MEASLES, MUMPS & RUBELLA VAC ~~LOC~~ INJ
0.5000 mL | INJECTION | Freq: Once | SUBCUTANEOUS | Status: DC
  Filled 2012-12-05: qty 0.5

## 2012-12-05 MED ORDER — SIMETHICONE 80 MG PO CHEW: 80.0000 mg | CHEWABLE_TABLET | ORAL | Status: DC | PRN

## 2012-12-05 MED ORDER — OXYTOCIN 40 UNITS IN LACTATED RINGERS INFUSION - SIMPLE MED: 62.5000 mL/h | INTRAVENOUS | Status: AC

## 2012-12-05 MED ORDER — DIPHENHYDRAMINE HCL 25 MG PO CAPS
25.0000 mg | ORAL_CAPSULE | ORAL | Status: DC | PRN
  Administered 2012-12-05: 25 mg via ORAL
  Filled 2012-12-05: qty 1

## 2012-12-05 MED ORDER — FERROUS SULFATE 325 (65 FE) MG PO TABS
325.0000 mg | ORAL_TABLET | Freq: Two times a day (BID) | ORAL | Status: DC
  Administered 2012-12-05 – 2012-12-06 (×3): 325 mg via ORAL
  Filled 2012-12-05 (×3): qty 1

## 2012-12-05 MED ORDER — MENTHOL 3 MG MT LOZG: 1.0000 | LOZENGE | OROMUCOSAL | Status: DC | PRN

## 2012-12-05 MED ORDER — ZOLPIDEM TARTRATE 5 MG PO TABS: 5.0000 mg | ORAL_TABLET | Freq: Every evening | ORAL | Status: DC | PRN

## 2012-12-05 MED ORDER — ONDANSETRON HCL 4 MG/2ML IJ SOLN: 4.0000 mg | Freq: Three times a day (TID) | INTRAMUSCULAR | Status: DC | PRN

## 2012-12-05 MED ORDER — ONDANSETRON HCL 4 MG/2ML IJ SOLN: 4.0000 mg | INTRAMUSCULAR | Status: DC | PRN

## 2012-12-05 MED ORDER — PRENATAL MULTIVITAMIN CH
1.0000 | ORAL_TABLET | Freq: Every day | ORAL | Status: DC
  Administered 2012-12-05: 1 via ORAL
  Filled 2012-12-05: qty 1

## 2012-12-05 MED ORDER — NALBUPHINE SYRINGE 5 MG/0.5 ML
5.0000 mg | INJECTION | INTRAMUSCULAR | Status: DC | PRN
  Administered 2012-12-05: 5 mg via INTRAVENOUS
  Filled 2012-12-05: qty 1
  Filled 2012-12-05: qty 0.5

## 2012-12-05 MED ORDER — NALBUPHINE SYRINGE 5 MG/0.5 ML
5.0000 mg | INJECTION | INTRAMUSCULAR | Status: DC | PRN
  Filled 2012-12-05: qty 1

## 2012-12-05 MED ORDER — NALOXONE HCL 1 MG/ML IJ SOLN
1.0000 ug/kg/h | INTRAMUSCULAR | Status: DC | PRN
  Filled 2012-12-05: qty 2

## 2012-12-05 MED ORDER — METHYLERGONOVINE MALEATE 0.2 MG/ML IJ SOLN
0.2000 mg | INTRAMUSCULAR | Status: DC | PRN
Start: 2012-12-05 — End: 2012-12-06

## 2012-12-05 MED ORDER — DIPHENHYDRAMINE HCL 25 MG PO CAPS: 25.0000 mg | ORAL_CAPSULE | Freq: Four times a day (QID) | ORAL | Status: DC | PRN

## 2012-12-05 MED ORDER — METHYLERGONOVINE MALEATE 0.2 MG PO TABS: 0.2000 mg | ORAL_TABLET | ORAL | Status: DC | PRN

## 2012-12-05 MED ORDER — DIBUCAINE 1 % RE OINT: 1.0000 "application " | TOPICAL_OINTMENT | RECTAL | Status: DC | PRN

## 2012-12-05 MED ORDER — ONDANSETRON HCL 4 MG PO TABS: 4.0000 mg | ORAL_TABLET | ORAL | Status: DC | PRN

## 2012-12-05 MED ORDER — OXYCODONE-ACETAMINOPHEN 5-325 MG PO TABS: 1.0000 | ORAL_TABLET | ORAL | Status: DC | PRN

## 2012-12-05 NOTE — Progress Notes (Signed)
CRITICAL VALUE ALERT  Critical value received: 6.9  Date of notification:  12/05/2012   Time of notification:  0655  Critical value read back:yes  Nurse who received alert:  Leary Roca RN  MD notified (1st page):  Shelly Lilliard CNM  Time of first page:  [phone]  MD notified (2nd page):  Time of second page:  Responding MD:    Time MD responded:

## 2012-12-05 NOTE — Anesthesia Postprocedure Evaluation (Signed)
  Anesthesia Post-op Note  Patient: Ashley Curtis  Procedure(s) Performed: Procedure(s) (LRB): Primary CESAREAN SECTION of baby girl at 2143 APGAR 9/9 (N/A)  Patient Location: PACU  Anesthesia Type: Spinal  Level of Consciousness: awake and alert   Airway and Oxygen Therapy: Patient Spontanous Breathing  Post-op Pain: mild  Post-op Assessment: Post-op Vital signs reviewed, Patient's Cardiovascular Status Stable, Respiratory Function Stable, Patent Airway and No signs of Nausea or vomiting  Last Vitals:  Filed Vitals:   12/05/12 0000  BP: 135/80  Pulse: 59  Temp: 36.9 C  Resp: 21    Post-op Vital Signs: stable   Complications: No apparent anesthesia complications

## 2012-12-05 NOTE — Progress Notes (Signed)
Patient was referred for history of depression/anxiety. * Referral screened out by Clinical Social Worker because none of the following criteria appear to apply:  ~ History of anxiety/depression during this pregnancy, or of post-partum depression.  ~ Diagnosis of anxiety and/or depression within last 3 years  ~ History of depression due to pregnancy loss/loss of child  OR * Patient's symptoms currently being treated with medication and/or therapy.  Please contact the Clinical Social Worker if needs arise, or by the patient's request. Pt's anxious feelings were situational.  She was prescribed medication, of which she took PRN.  Coped well during pregnancy without medication & reports feeling fine now.  Denies any depression.  

## 2012-12-05 NOTE — Progress Notes (Signed)
Subjective: Postpartum Day 1: Primary Cesarean Delivery due to [facility] Patient up ad lib, reports no syncope or dizziness. Denies HA, SOB, or tachycardia.  Reports some fatigue as a normal state pre-delivery Feeding:  Breastfeeding Contraceptive plan:  Undecided at this time  Objective: Vital signs in last 24 hours: Temp:  [97.7 F (36.5 C)-99.2 F (37.3 C)] 98.9 F (37.2 C) (07/14 0930) Pulse Rate:  [57-89] 89 (07/14 0930) Resp:  [16-21] 18 (07/14 0930) BP: (111-151)/(69-88) 115/72 mmHg (07/14 0930) SpO2:  [95 %-100 %] 95 % (07/14 0930) Weight:  [180 lb (81.647 kg)] 180 lb (81.647 kg) (07/13 1340)  Physical Exam:  General: alert, cooperative and no distress Lochia: appropriate Uterine Fundus: firm Incision: healing well DVT Evaluation: No evidence of DVT seen on physical exam. Negative Homan's sign. JP drain:   n/a   Recent Labs  12/02/12 1615 12/05/12 0620  HGB 7.7* 6.9*  HCT 24.2* 21.7*   Orthostatic Vital Signs Filed Vitals:   12/05/12 0737 12/05/12 0739 12/05/12 0740 12/05/12 0930  BP: 122/80 139/88 125/86 115/72  Pulse: 65 79 86 89  Temp: 98 F (36.7 C)   98.9 F (37.2 C)  TempSrc: Oral   Oral  Resp: 18   18  Height:      Weight:      SpO2: 98%   95%    Assessment/Plan: Status post Cesarean section day 1. Anemic No orthostatic hypotension R&B of blood transfusion were reviewed with the patient, patient verbalizes understanding of these risks and declines blood transfusion.  Pt wishes to try iron BID. Doing well postoperatively.  Continue current care. Plan for discharge tomorrow    Ashley Curtis 12/05/2012, 12:03 PM

## 2012-12-06 DIAGNOSIS — Z98891 History of uterine scar from previous surgery: Secondary | ICD-10-CM

## 2012-12-06 LAB — TYPE AND SCREEN: Unit division: 0

## 2012-12-06 LAB — BIRTH TISSUE RECOVERY COLLECTION (PLACENTA DONATION)

## 2012-12-06 MED ORDER — IBUPROFEN 600 MG PO TABS: 600.0000 mg | ORAL_TABLET | Freq: Four times a day (QID) | ORAL | Status: DC

## 2012-12-06 MED ORDER — POLYSACCHARIDE IRON COMPLEX 150 MG PO CAPS: 150.0000 mg | ORAL_CAPSULE | Freq: Two times a day (BID) | ORAL | Status: DC

## 2012-12-06 MED ORDER — HYDROCODONE-ACETAMINOPHEN 5-325 MG PO TABS: 1.0000 | ORAL_TABLET | Freq: Four times a day (QID) | ORAL | Status: DC | PRN

## 2012-12-06 NOTE — Plan of Care (Signed)
Problem: Discharge Progression Outcomes Goal: Remove staples per MD order Outcome: Completed/Met Date Met:  12/06/12 Will be removed in the office

## 2012-12-06 NOTE — Discharge Summary (Signed)
  Cesarean Section Delivery Discharge Summary  Ashley Curtis  DOB:    [birth year] MRN:    [id number] CSN:    [id number]  Date of admission:                  12/04/12  Date of discharge:                   12/06/12  Procedures this admission: Primary C/S for LGA  Date of Delivery: 12/04/12 by Dr. Estanislado Pandy  Newborn Data:  Live born female  Birth Weight: 7 lb 14.5 oz (3586 g) APGAR: 9, 9  Home with mother. Name: "Ashley Curtis" Circumcision Plan: n/a  History of Present Illness:  [name] is a 27 y.o. female, G1P1001, who presents at [redacted]w[redacted]d weeks gestation. The patient has been followed at the [facility]cology division of [facility].    Her pregnancy has been complicated by:  Patient Active Problem List   Diagnosis Date Noted  . Status post primary low transverse cesarean section 12/06/2012  . Pregnant state, incidental 06/23/2012  . Abnormal antibody titer 06/23/2012  . Anxiety   . Migraine     Hospital course:  The patient was admitted for early labor.   Her postpartum course was complicated by a hgb of 6.9, R&B of blood transfusion discussed and pt declined.  Pt is symptomatic with fatigue but denied HA, SOB, or tachycardia.  Symptoms and complications of anemia were discussed prior to discharge, and pt was encouraged to call if she becomes symptomatic.  Pt agreed to take Fe BID and increase iron rich foods.  She was discharged to home on postpartum day 2, per pt request, doing well.  Feeding:  breast  Contraception:  undecided at this time  Discharge hemoglobin:  Hemoglobin  Date Value Range Status  12/05/2012 6.9* 12.0 - 15.0 g/dL Final     REPEATED TO VERIFY     CRITICAL RESULT CALLED TO, READ BACK BY AND VERIFIED WITH:     HART, S. @ 0102 ON 12/05/2012 BY [email or web address].A  05/02/2012 12.3   Final     HCT  Date Value Range Status  12/05/2012 21.7* 36.0 - 46.0 % Final  05/02/2012 36   Final    Discharge Physical Exam:    General: alert, cooperative and no distress Lochia: appropriate Uterine Fundus: firm Incision: healing well DVT Evaluation: No evidence of DVT seen on physical exam. Negative Homan's sign.  Intrapartum Procedures: cesarean: low cervical, transverse Postpartum Procedures: none Complications-Operative and Postpartum: none  Discharge Diagnoses: Term Pregnancy-delivered and anemia  Discharge Information:  Activity:           pelvic rest Diet:                routine Medications: PNV, Ibuprofen, Iron and Vicodin Condition:      stable Instructions:  refer to practice specific booklet Discharge to: home  Follow-up Information   Follow up with [facility]. Schedule an appointment as soon as possible for a visit in 5 weeks. (Call with any questions or concerns)    Contact information:   [address] [place] [address] [phone]       Haroldine Laws 12/06/2012

## 2012-12-07 NOTE — Anesthesia Postprocedure Evaluation (Signed)
  Anesthesia Post-op Note  Patient: Ashley Curtis  Procedure(s) Performed: * No procedures listed *  Patient Location: PACU  Anesthesia Type: Epidural  Level of Consciousness: awake and alert   Airway and Oxygen Therapy: Patient Spontanous Breathing  Post-op Pain: mild  Post-op Assessment: Post-op Vital signs reviewed, Patient's Cardiovascular Status Stable, Respiratory Function Stable, Patent Airway and No signs of Nausea or vomiting  Last Vitals: There were no vitals filed for this visit.  Post-op Vital Signs: stable   Complications: No apparent anesthesia complications

## 2012-12-09 ENCOUNTER — Ambulatory Visit (HOSPITAL_COMMUNITY)
Admission: RE | Admit: 2012-12-09 | Discharge: 2012-12-09 | Disposition: A | Discharge status: Home / Self Care | Payer: BC Managed Care – PPO | Source: Ambulatory Visit | Attending: Obstetrics and Gynecology | Admitting: Obstetrics and Gynecology

## 2012-12-09 NOTE — Lactation Note (Addendum)
Infant Lactation Consultation Outpatient Visit Note  Referral from pediatrician for weight loss.  Patient Name: Ashley Curtis Date of Birth: 12-Apr-1986 Birth Weight:  7+14 Gestational Age at Delivery: Gestational Age: <None> Type of Delivery: c-section  Breastfeeding History Frequency of Breastfeeding: 10-12 times Length of Feeding:  Voids: 4 Stools: 4  Supplementing / Method: Pumping:  Type of Pump:harmony   Frequency: x 2  Volume:  2 ounces each time  Comments:    Consultation Evaluation:  Breast feeding well.  Assisted with positioning to improve nipple comfort with good results.  Gained 5 oz since yesterday.  Initial Feeding Assessment: Pre-feed [id number] Post-feed UJWJXB:1478 Amount Transferred:8 ml Comments:Ate and fell asleep  Additional Feeding Assessment: Pre-feed GNFAOZ:3086 Post-feed Weight:3580 Amount Transferred:6 ml Comments: Parents report that baby ate about 1.5 hours ago  Total Breast milk Transferred this Visit: 15 ml but is back to birth weight!    Additional Interventions:   Follow-Up Smart Start  BF support group      Soyla Dryer 12/09/2012, 4:25 PM

## 2012-12-23 DEATH — deceased

## 2013-08-28 ENCOUNTER — Emergency Department (HOSPITAL_COMMUNITY)
Admission: EM | Admit: 2013-08-28 | Discharge: 2013-08-28 | Disposition: A | Discharge status: Home / Self Care | Payer: No Typology Code available for payment source | Source: Home / Self Care | Attending: Emergency Medicine | Admitting: Emergency Medicine

## 2013-08-28 ENCOUNTER — Encounter (HOSPITAL_COMMUNITY): Payer: Self-pay | Admitting: Emergency Medicine

## 2013-08-28 DIAGNOSIS — J029 Acute pharyngitis, unspecified: Secondary | ICD-10-CM

## 2013-08-28 LAB — POCT RAPID STREP A: Streptococcus, Group A Screen (Direct): NEGATIVE

## 2013-08-28 MED ORDER — AMOXICILLIN 500 MG PO CAPS: 500.0000 mg | ORAL_CAPSULE | Freq: Three times a day (TID) | ORAL | Status: DC

## 2013-08-28 NOTE — ED Provider Notes (Signed)
  Chief Complaint   Chief Complaint  Patient presents with  . 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 She has been exposed to strep. She also has a history of tonsillitis and strep and was advised to have a tonsillectomy.   Review of Systems   Other than as noted above, the patient denies any of the following symptoms. Systemic:  No fever, chills, sweats, myalgias, or headache. Eye:  No redness, pain or drainage. ENT:  No earache, nasal congestion, sneezing, rhinorrhea, sinus pressure, sinus pain, or post nasal drip. Lungs:  No cough, sputum production, wheezing, shortness of breath, or chest pain. GI:  No abdominal pain, nausea, vomiting, or diarrhea. Skin:  No rash.  PMFSH   Past medical history, family history, social history, meds, and allergies were reviewed. She is breast-feeding right now. She has a history of migraine headaches.  Physical Exam     Vital signs:  BP 110/55  Pulse 97  Temp(Src) 98.8 F (37.1 C) (Oral)  Resp 12  SpO2 98%  LMP 07/23/2013  Breastfeeding? Yes General:  Alert, in no distress. Phonation was normal, no drooling, and patient was able to handle secretions well.  Eye:  No conjunctival injection or drainage. Lids were normal. ENT:  TMs and canals were normal, without erythema or inflammation.  Nasal mucosa was clear and uncongested, without drainage.  Mucous membranes were moist.  Exam of pharynx reveals erythema and swelling but no exudate, tonsils are not particularly enlarged.  There were no oral ulcerations or lesions. There was no bulging of the tonsillar pillars, and the uvula was midline. Neck:  Supple, no adenopathy, tenderness or mass. Lungs:  No respiratory distress.  Lungs were clear to auscultation, without  wheezes, rales or rhonchi.  Breath sounds were clear and equal bilaterally.  Heart:  Regular rhythm, without gallops, murmers or rubs. Skin:  Clear, warm, and dry, without rash or lesions.  Labs   Results for orders placed during the hospital encounter of 08/28/13  POCT RAPID STREP A ([facility]RG CARE ONLY)      Result Value Ref Range   Streptococcus, Group A Screen (Direct) NEGATIVE  NEGATIVE   Assessment   The encounter diagnosis was Pharyngitis.  There is no evidence of a peritonsillar abscess.    Plan     1.  Meds:  The following meds were prescribed:   New Prescriptions   AMOXICILLIN (AMOXIL) 500 MG CAPSULE    Take 1 capsule (500 mg total) by mouth 3 (three) times daily.    2.  Patient Education/Counseling:  The patient was given appropriate handouts, self care instructions, and instructed in symptomatic relief, including hot saline gargles, throat lozenges, infectious precautions, and need to trade out toothbrush.    3.  Follow up:  The patient was told to follow up here if no better in 3 to 4 days, or sooner if becoming worse in any way, and given some red flag symptoms such as difficulty swallowing or breathing which would prompt immediate return.  Advised to followup with Dr. Suzanna ObeyJohn Byers about possibility of tonsillectomy.     Reuben Likesavid C Aryiah Monterosso, MD 08/28/13 [phone]

## 2013-08-28 NOTE — Discharge Instructions (Signed)
Pharyngitis °Pharyngitis is redness, pain, and swelling (inflammation) of your pharynx.  °CAUSES  °Pharyngitis is usually caused by infection. Most of the time, these infections are from viruses (viral) and are part of a cold. However, sometimes pharyngitis is caused by bacteria (bacterial). Pharyngitis can also be caused by allergies. Viral pharyngitis may be spread from person to person by coughing, sneezing, and personal items or utensils (cups, forks, spoons, toothbrushes). Bacterial pharyngitis may be spread from person to person by more intimate contact, such as kissing.  °SIGNS AND SYMPTOMS  °Symptoms of pharyngitis include:   °· Sore throat.   °· Tiredness (fatigue).   °· Low-grade fever.   °· Headache. °· Joint pain and muscle aches. °· Skin rashes. °· Swollen lymph nodes. °· Plaque-like film on throat or tonsils (often seen with bacterial pharyngitis). °DIAGNOSIS  °Your health care provider will ask you questions about your illness and your symptoms. Your medical history, along with a physical exam, is often all that is needed to diagnose pharyngitis. Sometimes, a rapid strep test is done. Other lab tests may also be done, depending on the suspected cause.  °TREATMENT  °Viral pharyngitis will usually get better in 3 4 days without the use of medicine. Bacterial pharyngitis is treated with medicines that kill germs (antibiotics).  °HOME CARE INSTRUCTIONS  °· Drink enough water and fluids to keep your urine clear or pale yellow.   °· Only take over-the-counter or prescription medicines as directed by your health care provider:   °· If you are prescribed antibiotics, make sure you finish them even if you start to feel better.   °· Do not take aspirin.   °· Get lots of rest.   °· Gargle with 8 oz of salt water (½ tsp of salt per 1 qt of water) as often as every 1 2 hours to soothe your throat.   °· Throat lozenges (if you are not at risk for choking) or sprays may be used to soothe your throat. °SEEK MEDICAL  CARE IF:  °· You have large, tender lumps in your neck. °· You have a rash. °· You cough up green, yellow-brown, or bloody spit. °SEEK IMMEDIATE MEDICAL CARE IF:  °· Your neck becomes stiff. °· You drool or are unable to swallow liquids. °· You vomit or are unable to keep medicines or liquids down. °· You have severe pain that does not go away with the use of recommended medicines. °· You have trouble breathing (not caused by a stuffy nose). °MAKE SURE YOU:  °· Understand these instructions. °· Will watch your condition. °· Will get help right away if you are not doing well or get worse. °Document Released: 05/11/2005 Document Revised: 03/01/2013 Document Reviewed: 01/16/2013 °Exit[employer]® Patient Information ©2014 [employer]. ° °

## 2013-08-28 NOTE — ED Notes (Signed)
C/o ST . Ear pain and fullness

## 2013-08-30 ENCOUNTER — Encounter (HOSPITAL_COMMUNITY): Payer: Self-pay | Admitting: Emergency Medicine

## 2013-08-30 ENCOUNTER — Emergency Department (HOSPITAL_COMMUNITY)
Admission: EM | Admit: 2013-08-30 | Discharge: 2013-08-30 | Disposition: A | Discharge status: Home / Self Care | Payer: No Typology Code available for payment source | Source: Home / Self Care

## 2013-08-30 DIAGNOSIS — R05 Cough: Secondary | ICD-10-CM

## 2013-08-30 DIAGNOSIS — R059 Cough, unspecified: Secondary | ICD-10-CM

## 2013-08-30 DIAGNOSIS — J309 Allergic rhinitis, unspecified: Secondary | ICD-10-CM

## 2013-08-30 DIAGNOSIS — R030 Elevated blood-pressure reading, without diagnosis of hypertension: Secondary | ICD-10-CM

## 2013-08-30 LAB — CULTURE, GROUP A STREP

## 2013-08-30 NOTE — ED Provider Notes (Signed)
Medical screening examination/treatment/procedure(s) were performed by resident physician or non-physician practitioner and as supervising physician I was immediately available for consultation/collaboration.   Kanan Sobek DOUGLAS MD.   Jacari Kirsten D Hayze Gazda, MD 08/30/13 2014 

## 2013-08-30 NOTE — ED Provider Notes (Signed)
CSN: [id number]     Arrival date & time 08/30/13  1400 History   First MD Initiated Contact with Patient 08/30/13 1534     Chief Complaint  Patient presents with  . Follow-up   (Consider location/radiation/quality/duration/timing/severity/associated sxs/prior Treatment) HPI Comments: 28 year old female presents with persistent soreness in the throat associated with cough and headache after being seen at the urgent care 2 days ago. At that time her strep screen was negative in the followup culture was negative for beta hemolytic strep. She is continuing to take her antibiotics. She states her throat is worse when associated with a cough. Denies fevers, chills.   Past Medical History  Diagnosis Date  . Anxiety   . Migraine   . Anemia    Past Surgical History  Procedure Laterality Date  . Appendectomy      6 yoa  . Cesarean section N/A 12/04/2012    Procedure: Primary CESAREAN SECTION of baby girl at 2143 APGAR 9/9;  Surgeon: Esmeralda Arthur, MD;  Location: [facility];  Service: Obstetrics;  Laterality: N/A;   Family History  Problem Relation Age of Onset  . Gestational diabetes Mother   . Seizures Brother    History  Substance Use Topics  . Smoking status: Former Smoker    Quit date: 03/25/2012  . Smokeless tobacco: Never Used  . Alcohol Use: Yes     Comment: Occasionally prior to pregnancy   OB History   Grav Para Term Preterm Abortions TAB SAB Ect Mult Living   1 1 1       1      Review of Systems  Constitutional: Positive for activity change. Negative for fever, chills, diaphoresis, appetite change and fatigue.  HENT: Positive for congestion, ear pain, postnasal drip, rhinorrhea, sinus pressure and sore throat. Negative for facial swelling.   Eyes: Negative.  Negative for visual disturbance.  Respiratory: Positive for cough. Negative for shortness of breath and wheezing.   Cardiovascular: Negative.   Gastrointestinal: Negative.   Musculoskeletal: Negative for neck pain and  neck stiffness.  Skin: Negative for pallor and rash.  Neurological: Negative.     Allergies  Review of patient's allergies indicates no known allergies.  Home Medications   Current Outpatient Rx  Name  Route  Sig  Dispense  Refill  . Prenatal Vit-Fe Fumarate-FA (MULTIVITAMIN-PRENATAL) 27-0.8 MG TABS tablet   Oral   Take 1 tablet by mouth daily at 12 noon.         Marland Kitchen amoxicillin (AMOXIL) 500 MG capsule   Oral   Take 1 capsule (500 mg total) by mouth 3 (three) times daily.   30 capsule   0     Dispense as written.   Marland Kitchen HYDROcodone-acetaminophen (NORCO) 5-325 MG per tablet   Oral   Take 1 tablet by mouth every 6 (six) hours as needed for pain.   30 tablet   0   . ibuprofen (ADVIL,MOTRIN) 600 MG tablet   Oral   Take 1 tablet (600 mg total) by mouth every 6 (six) hours.   30 tablet   0   . iron polysaccharides (NIFEREX) 150 MG capsule   Oral   Take 1 capsule (150 mg total) by mouth 2 (two) times daily.   60 capsule   6   . ranitidine (ZANTAC) 150 MG tablet   Oral   Take 150 mg by mouth at bedtime as needed for heartburn.           BP 129/101  Pulse  92  Temp(Src) 98.6 F (37 C) (Oral)  Resp 18  SpO2 100%  LMP 07/23/2013  Breastfeeding? Yes Physical Exam  Nursing note and vitals reviewed. Constitutional: She is oriented to person, place, and time. She appears well-developed and well-nourished. No distress.  HENT:  Mouth/Throat: No oropharyngeal exudate.  Bilateral TMs are normal with the exception of mild retraction on the right. Oropharynx with mild erythema and cobblestoning associated with clear PND. No tonsillar enlargement or exudates.  Eyes: Conjunctivae and EOM are normal.  Neck: Normal range of motion. Neck supple.  Cardiovascular: Normal rate, regular rhythm and normal heart sounds.   Pulmonary/Chest: Effort normal and breath sounds normal. No respiratory distress. She has no wheezes. She has no rales.  Musculoskeletal: Normal range of motion. She  exhibits no edema.  Lymphadenopathy:    She has no cervical adenopathy.  Neurological: She is alert and oriented to person, place, and time.  Skin: Skin is warm and dry. No rash noted.  Psychiatric: She has a normal mood and affect.    ED Course  Procedures (including critical care time) Labs Review Labs Reviewed - No data to display Imaging Review No results found.   MDM   1. Allergic rhinitis   2. Cough   3. Elevated blood pressure reading      flonase and saline nasal spray Allegra Robitussin DM Sudafed PE    Hayden Rasmussenavid Zeke Aker, NP 08/30/13 1553

## 2013-08-30 NOTE — Discharge Instructions (Signed)
Allergic Rhinitis Allegra 180 mg a day flonase nasal spray Lots of saline nasal spray Sudafed PE for nasal congestion Robitussin Dm for cough Allergic rhinitis is when the mucous membranes in the nose respond to allergens. Allergens are particles in the air that cause your body to have an allergic reaction. This causes you to release allergic antibodies. Through a chain of events, these eventually cause you to release histamine into the blood stream. Although meant to protect the body, it is this release of histamine that causes your discomfort, such as frequent sneezing, congestion, and an itchy, runny nose.  CAUSES  Seasonal allergic rhinitis (hay fever) is caused by pollen allergens that may come from grasses, trees, and weeds. Year-round allergic rhinitis (perennial allergic rhinitis) is caused by allergens such as house dust mites, pet dander, and mold spores.  SYMPTOMS   Nasal stuffiness (congestion).  Itchy, runny nose with sneezing and tearing of the eyes. DIAGNOSIS  Your health care provider can help you determine the allergen or allergens that trigger your symptoms. If you and your health care provider are unable to determine the allergen, skin or blood testing may be used. TREATMENT  Allergic Rhinitis does not have a cure, but it can be controlled by:  Medicines and allergy shots (immunotherapy).  Avoiding the allergen. Hay fever may often be treated with antihistamines in pill or nasal spray forms. Antihistamines block the effects of histamine. There are over-the-counter medicines that may help with nasal congestion and swelling around the eyes. Check with your health care provider before taking or giving this medicine.  If avoiding the allergen or the medicine prescribed do not work, there are many new medicines your health care provider can prescribe. Stronger medicine may be used if initial measures are ineffective. Desensitizing injections can be used if medicine and avoidance  does not work. Desensitization is when a patient is given ongoing shots until the body becomes less sensitive to the allergen. Make sure you follow up with your health care provider if problems continue. HOME CARE INSTRUCTIONS It is not possible to completely avoid allergens, but you can reduce your symptoms by taking steps to limit your exposure to them. It helps to know exactly what you are allergic to so that you can avoid your specific triggers. SEEK MEDICAL CARE IF:   You have a fever.  You develop a cough that does not stop easily (persistent).  You have shortness of breath.  You start wheezing.  Symptoms interfere with normal daily activities. Document Released: 02/03/2001 Document Revised: 03/01/2013 Document Reviewed: 01/16/2013 [facility] Patient Information 2014 [place], [place].  Cough, Adult  A cough is a reflex that helps clear your throat and airways. It can help heal the body or may be a reaction to an irritated airway. A cough may only last 2 or 3 weeks (acute) or may last more than 8 weeks (chronic).  CAUSES Acute cough:  Viral or bacterial infections. Chronic cough:  Infections.  Allergies.  Asthma.  Post-nasal drip.  Smoking.  Heartburn or acid reflux.  Some medicines.  Chronic lung problems (COPD).  Cancer. SYMPTOMS   Cough.  Fever.  Chest pain.  Increased breathing rate.  High-pitched whistling sound when breathing (wheezing).  Colored mucus that you cough up (sputum). TREATMENT   A bacterial cough may be treated with antibiotic medicine.  A viral cough must run its course and will not respond to antibiotics.  Your caregiver may recommend other treatments if you have a chronic cough. HOME CARE INSTRUCTIONS  Only take over-the-counter or prescription medicines for pain, discomfort, or fever as directed by your caregiver. Use cough suppressants only as directed by your caregiver.  Use a cold steam vaporizer or humidifier in your  bedroom or home to help loosen secretions.  Sleep in a semi-upright position if your cough is worse at night.  Rest as needed.  Stop smoking if you smoke. SEEK IMMEDIATE MEDICAL CARE IF:   You have pus in your sputum.  Your cough starts to worsen.  You cannot control your cough with suppressants and are losing sleep.  You begin coughing up blood.  You have difficulty breathing.  You develop pain which is getting worse or is uncontrolled with medicine.  You have a fever. MAKE SURE YOU:   Understand these instructions.  Will watch your condition.  Will get help right away if you are not doing well or get worse. Document Released: 11/07/2010 Document Revised: 08/03/2011 Document Reviewed: 11/07/2010 [facility] Patient Information 2014 [place], [place].  Hypertension Hypertension is another name for high blood pressure. High blood pressure may mean that your heart needs to work harder to pump blood. Blood pressure consists of two numbers, which includes a higher number over a lower number (example: 110/72). HOME CARE   Make lifestyle changes as told by your doctor. This may include weight loss and exercise.  Take your blood pressure medicine every day.  Limit how much salt you use.  Stop smoking if you smoke.  Do not use drugs.  Talk to your doctor if you are using decongestants or birth control pills. These medicines might make blood pressure higher.  Females should not drink more than 1 alcoholic drink per day. Males should not drink more than 2 alcoholic drinks per day.  See your doctor as told. GET HELP RIGHT AWAY IF:   You have a blood pressure reading with a top number of 180 or higher.  You get a very bad headache.  You get blurred or changing vision.  You feel confused.  You feel weak, numb, or faint.  You get chest or belly (abdominal) pain.  You throw up (vomit).  You cannot breathe very well. MAKE SURE YOU:   Understand these  instructions.  Will watch your condition.  Will get help right away if you are not doing well or get worse. Document Released: 10/28/2007 Document Revised: 08/03/2011 Document Reviewed: 10/28/2007 [facility] Patient Information 2014 [place], [place].

## 2013-08-30 NOTE — ED Notes (Signed)
Pt c/o persistent sore throat Seen here on 4/6; Given Amox; tolerating well Sxs include: feeling warm, abd  Pain, congestion Pt is alert w/no signs of acute distress.

## 2014-03-26 ENCOUNTER — Encounter (HOSPITAL_COMMUNITY): Payer: Self-pay | Admitting: Emergency Medicine

## 2014-06-07 ENCOUNTER — Encounter (HOSPITAL_COMMUNITY): Payer: Self-pay | Admitting: Obstetrics and Gynecology

## 2014-07-27 ENCOUNTER — Ambulatory Visit
Admission: RE | Admit: 2014-07-27 | Discharge: 2014-07-27 | Disposition: A | Discharge status: Home / Self Care | Payer: No Typology Code available for payment source | Source: Ambulatory Visit | Attending: Infectious Disease | Admitting: Infectious Disease

## 2014-07-27 ENCOUNTER — Other Ambulatory Visit: Payer: Self-pay | Admitting: Infectious Disease

## 2014-07-27 DIAGNOSIS — R7611 Nonspecific reaction to tuberculin skin test without active tuberculosis: Secondary | ICD-10-CM

## 2014-11-01 ENCOUNTER — Encounter (HOSPITAL_COMMUNITY): Payer: Self-pay | Admitting: Obstetrics and Gynecology

## 2015-02-02 ENCOUNTER — Emergency Department (HOSPITAL_COMMUNITY): Payer: No Typology Code available for payment source

## 2015-02-02 ENCOUNTER — Encounter (HOSPITAL_COMMUNITY): Payer: Self-pay | Admitting: *Deleted

## 2015-02-02 ENCOUNTER — Emergency Department (HOSPITAL_COMMUNITY)
Admission: EM | Admit: 2015-02-02 | Discharge: 2015-02-02 | Disposition: A | Discharge status: Home / Self Care | Payer: No Typology Code available for payment source | Attending: Emergency Medicine | Admitting: Emergency Medicine

## 2015-02-02 DIAGNOSIS — Z8679 Personal history of other diseases of the circulatory system: Secondary | ICD-10-CM | POA: Insufficient documentation

## 2015-02-02 DIAGNOSIS — S93401A Sprain of unspecified ligament of right ankle, initial encounter: Secondary | ICD-10-CM

## 2015-02-02 DIAGNOSIS — X58XXXA Exposure to other specified factors, initial encounter: Secondary | ICD-10-CM | POA: Insufficient documentation

## 2015-02-02 DIAGNOSIS — D649 Anemia, unspecified: Secondary | ICD-10-CM | POA: Insufficient documentation

## 2015-02-02 DIAGNOSIS — Z720 Tobacco use: Secondary | ICD-10-CM | POA: Insufficient documentation

## 2015-02-02 DIAGNOSIS — Z79899 Other long term (current) drug therapy: Secondary | ICD-10-CM | POA: Insufficient documentation

## 2015-02-02 DIAGNOSIS — Y998 Other external cause status: Secondary | ICD-10-CM | POA: Insufficient documentation

## 2015-02-02 DIAGNOSIS — Y9301 Activity, walking, marching and hiking: Secondary | ICD-10-CM | POA: Insufficient documentation

## 2015-02-02 DIAGNOSIS — F419 Anxiety disorder, unspecified: Secondary | ICD-10-CM | POA: Insufficient documentation

## 2015-02-02 DIAGNOSIS — Z792 Long term (current) use of antibiotics: Secondary | ICD-10-CM | POA: Insufficient documentation

## 2015-02-02 DIAGNOSIS — Y9289 Other specified places as the place of occurrence of the external cause: Secondary | ICD-10-CM | POA: Insufficient documentation

## 2015-02-02 MED ORDER — IBUPROFEN 800 MG PO TABS
800.0000 mg | ORAL_TABLET | Freq: Once | ORAL | Status: AC
  Administered 2015-02-02: 800 mg via ORAL
  Filled 2015-02-02 (×2): qty 1

## 2015-02-02 MED ORDER — IBUPROFEN 600 MG PO TABS: 600.0000 mg | ORAL_TABLET | Freq: Four times a day (QID) | ORAL | Status: DC | PRN

## 2015-02-02 NOTE — ED Notes (Addendum)
Pt stated her pain is a 7/10. Minimal swelling. Pt c/o pain with weight bearing. Ankle to be immoblized. Pt given  of motrin.

## 2015-02-02 NOTE — ED Notes (Signed)
Slipped walking down ramp, pain in rt ankle. Tender to touch as well as top of foot

## 2015-02-02 NOTE — ED Provider Notes (Signed)
CSN: [id number]     Arrival date & time 02/02/15  0932 History   None    Chief Complaint  Patient presents with  . Ankle Pain    Rt     (Consider location/radiation/quality/duration/timing/severity/associated sxs/prior Treatment) HPI   29 year old female presents for evaluation of R ankle injury.  Pt report approximately 2 hrs ago she was walking down a handicap ramp, accidentally twisted her R ankle and fell down.  Report acute onset of sharp pain of moderate intensity radiates from her foot to her ankle.  Able to ambulate afterward with pain.  Denies knee or hip pain.  Denies numbness or weakness. Denies any other injury. Report she is not pregnant.  Past Medical History  Diagnosis Date  . Anxiety   . Migraine   . Anemia    Past Surgical History  Procedure Laterality Date  . Appendectomy      6 yoa  . Cesarean section N/A 12/04/2012    Procedure: Primary CESAREAN SECTION of baby girl at 2143 APGAR 9/9;  Surgeon: Esmeralda Arthur, MD;  Location: [facility];  Service: Obstetrics;  Laterality: N/A;   Family History  Problem Relation Age of Onset  . Gestational diabetes Mother   . Seizures Brother    Social History  Substance Use Topics  . Smoking status: Current Every Day Smoker  . Smokeless tobacco: Never Used  . Alcohol Use: Yes     Comment: Occasionally prior to pregnancy   OB History    Gravida Para Term Preterm AB TAB SAB Ectopic Multiple Living   Review of Systems  Constitutional: Negative for fever.  Musculoskeletal: Positive for arthralgias.  Neurological: Negative for numbness.      Allergies  Review of patient's allergies indicates no known allergies.  Home Medications   Prior to Admission medications   Medication Sig Start Date End Date Taking? Authorizing Provider  amoxicillin (AMOXIL) 500 MG capsule Take 1 capsule (500 mg total) by mouth 3 (three) times daily. 08/28/13   Reuben Likes, MD  HYDROcodone-acetaminophen (NORCO) 5-325 MG  per tablet Take 1 tablet by mouth every 6 (six) hours as needed for pain. 12/06/12   Haroldine Laws, CNM  ibuprofen (ADVIL,MOTRIN) 600 MG tablet Take 1 tablet (600 mg total) by mouth every 6 (six) hours. 12/06/12   Haroldine Laws, CNM  iron polysaccharides (NIFEREX) 150 MG capsule Take 1 capsule (150 mg total) by mouth 2 (two) times daily. 12/06/12   Haroldine Laws, CNM  Prenatal Vit-Fe Fumarate-FA (MULTIVITAMIN-PRENATAL) 27-0.8 MG TABS tablet Take 1 tablet by mouth daily at 12 noon.    Historical Provider, MD  ranitidine (ZANTAC) 150 MG tablet Take 150 mg by mouth at bedtime as needed for heartburn.     Historical Provider, MD   BP 107/92 mmHg  Pulse 83  Temp(Src) 98.3 F (36.8 C) (Oral)  Resp 16  SpO2 100% Physical Exam  Constitutional: She appears well-developed and well-nourished. No distress.  HENT:  Head: Atraumatic.  Eyes: Conjunctivae are normal.  Neck: Neck supple.  Musculoskeletal: She exhibits tenderness (R ankle: tenderness to lateral malleolar region and proximal dorsum midfoot.  intact pedal pulse, no bruising, edema, or warmth noted.  no crepitus. ).  R knee and hip are normal.  Neurological: She is alert.  Skin: No rash noted.  Psychiatric: She has a normal mood and affect.  Nursing note and vitals reviewed.   ED Course  Procedures (including critical care time)  Mechanical injury of R ankle/foot.  Doubt fx/dislocation but pt is concern therefore xrays ordered. Pain medication given.    10:41 AM X-rays of right foot and ankle show no acute fractures or dislocation. Patient will be provided an ASO for support.  RICE therapy discussed.  Ortho referral given.     Imaging Review Dg Ankle Complete Right  02/02/2015   CLINICAL DATA:  Ankle pain and swelling for 2 days after twisting injury. Initial encounter.  EXAM: RIGHT ANKLE - COMPLETE 3+ VIEW  COMPARISON:  None.  FINDINGS: There is no evidence of fracture, dislocation, or joint effusion.  IMPRESSION: Negative.    Electronically Signed   By: Marnee Spring M.D.   On: 02/02/2015 11:10   Dg Foot Complete Right  02/02/2015   CLINICAL DATA:  Twisting injury with right foot pain  EXAM: RIGHT FOOT COMPLETE - 3+ VIEW  COMPARISON:  Ankle radiographs dictated separately under a dedicated report  FINDINGS: There is no evidence of fracture or dislocation. There is no evidence of arthropathy or other focal bone abnormality. Soft tissues are unremarkable.  IMPRESSION: Negative.   Electronically Signed   By: Christiana Pellant M.D.   On: 02/02/2015 11:11   I have personally reviewed and evaluated these images and lab results as part of my medical decision-making.   EKG Interpretation None      MDM   Final diagnoses:  Right ankle sprain, initial encounter    BP 114/67 mmHg  Pulse 76  Temp(Src) 98 F (36.7 C) (Oral)  Resp 18  SpO2 100%     Fayrene Helper, PA-C 02/02/15 1135  Gerhard Munch, MD 02/03/15 [phone]

## 2015-02-02 NOTE — Discharge Instructions (Signed)

## 2015-02-02 NOTE — ED Notes (Signed)
Bed: WTR6 Expected date:  Expected time:  Means of arrival:  Comments: 

## 2015-10-22 ENCOUNTER — Encounter (HOSPITAL_BASED_OUTPATIENT_CLINIC_OR_DEPARTMENT_OTHER): Payer: Self-pay | Admitting: *Deleted

## 2015-10-22 ENCOUNTER — Emergency Department (HOSPITAL_BASED_OUTPATIENT_CLINIC_OR_DEPARTMENT_OTHER)
Admission: EM | Admit: 2015-10-22 | Discharge: 2015-10-22 | Disposition: A | Discharge status: Home / Self Care | Payer: No Typology Code available for payment source | Attending: Emergency Medicine | Admitting: Emergency Medicine

## 2015-10-22 ENCOUNTER — Emergency Department (HOSPITAL_COMMUNITY)
Admission: EM | Admit: 2015-10-22 | Discharge: 2015-10-22 | Discharge status: Against Medical Advice | Payer: No Typology Code available for payment source

## 2015-10-22 ENCOUNTER — Emergency Department (HOSPITAL_BASED_OUTPATIENT_CLINIC_OR_DEPARTMENT_OTHER): Payer: No Typology Code available for payment source

## 2015-10-22 DIAGNOSIS — O99331 Smoking (tobacco) complicating pregnancy, first trimester: Secondary | ICD-10-CM | POA: Insufficient documentation

## 2015-10-22 DIAGNOSIS — Z3A01 Less than 8 weeks gestation of pregnancy: Secondary | ICD-10-CM | POA: Insufficient documentation

## 2015-10-22 DIAGNOSIS — F17210 Nicotine dependence, cigarettes, uncomplicated: Secondary | ICD-10-CM | POA: Insufficient documentation

## 2015-10-22 DIAGNOSIS — Z3490 Encounter for supervision of normal pregnancy, unspecified, unspecified trimester: Secondary | ICD-10-CM

## 2015-10-22 DIAGNOSIS — O26891 Other specified pregnancy related conditions, first trimester: Secondary | ICD-10-CM | POA: Insufficient documentation

## 2015-10-22 DIAGNOSIS — R1030 Lower abdominal pain, unspecified: Secondary | ICD-10-CM | POA: Insufficient documentation

## 2015-10-22 LAB — CBC WITH DIFFERENTIAL/PLATELET
BASOS ABS: 0 10*3/uL (ref 0.0–0.1)
BASOS PCT: 0 %
EOS ABS: 0.2 10*3/uL (ref 0.0–0.7)
Eosinophils Relative: 3 %
HCT: 39.1 % (ref 36.0–46.0)
HEMOGLOBIN: 13.5 g/dL (ref 12.0–15.0)
Lymphocytes Relative: 36 %
Lymphs Abs: 3.3 10*3/uL (ref 0.7–4.0)
MCH: 30.8 pg (ref 26.0–34.0)
MCHC: 34.5 g/dL (ref 30.0–36.0)
MCV: 89.1 fL (ref 78.0–100.0)
MONOS PCT: 8 %
Monocytes Absolute: 0.8 10*3/uL (ref 0.1–1.0)
NEUTROS ABS: 4.9 10*3/uL (ref 1.7–7.7)
NEUTROS PCT: 53 %
Platelets: 312 10*3/uL (ref 150–400)
RBC: 4.39 MIL/uL (ref 3.87–5.11)
RDW: 12.4 % (ref 11.5–15.5)
WBC: 9.3 10*3/uL (ref 4.0–10.5)

## 2015-10-22 LAB — BASIC METABOLIC PANEL
Anion gap: 8 (ref 5–15)
BUN: 11 mg/dL (ref 6–20)
CO2: 23 mmol/L (ref 22–32)
CREATININE: 0.54 mg/dL (ref 0.44–1.00)
Calcium: 9.3 mg/dL (ref 8.9–10.3)
Chloride: 105 mmol/L (ref 101–111)
Glucose, Bld: 91 mg/dL (ref 65–99)
POTASSIUM: 3.6 mmol/L (ref 3.5–5.1)
SODIUM: 136 mmol/L (ref 135–145)

## 2015-10-22 LAB — URINALYSIS, ROUTINE W REFLEX MICROSCOPIC
BILIRUBIN URINE: NEGATIVE
GLUCOSE, UA: NEGATIVE mg/dL
HGB URINE DIPSTICK: NEGATIVE
Ketones, ur: NEGATIVE mg/dL
Leukocytes, UA: NEGATIVE
Nitrite: NEGATIVE
PROTEIN: NEGATIVE mg/dL
SPECIFIC GRAVITY, URINE: 1.012 (ref 1.005–1.030)
pH: 6.5 (ref 5.0–8.0)

## 2015-10-22 LAB — WET PREP, GENITAL
Clue Cells Wet Prep HPF POC: NONE SEEN
Sperm: NONE SEEN
TRICH WET PREP: NONE SEEN
WBC WET PREP: NONE SEEN
YEAST WET PREP: NONE SEEN

## 2015-10-22 LAB — LIPASE, BLOOD: LIPASE: 29 U/L (ref 11–51)

## 2015-10-22 LAB — PREGNANCY, URINE: PREG TEST UR: POSITIVE — AB

## 2015-10-22 LAB — HCG, QUANTITATIVE, PREGNANCY: hCG, Beta Chain, Quant, S: 480 m[IU]/mL — ABNORMAL HIGH (ref <5)

## 2015-10-22 MED ORDER — PRENATAL COMPLETE 14-0.4 MG PO TABS
1.0000 | ORAL_TABLET | Freq: Every day | ORAL | Status: DC
Start: 2015-10-22 — End: 2016-08-20

## 2015-10-22 MED ORDER — SODIUM CHLORIDE 0.9 % IV BOLUS (SEPSIS)
1000.0000 mL | Freq: Once | INTRAVENOUS | Status: AC
Start: 2015-10-22 — End: 2015-10-22
  Administered 2015-10-22: 1000 mL via INTRAVENOUS

## 2015-10-22 NOTE — Discharge Instructions (Signed)
First Trimester of Pregnancy The first trimester of pregnancy is from week 1 until the end of week 12 (months 1 through 3). A week after a sperm fertilizes an egg, the egg will implant on the wall of the uterus. This embryo will begin to develop into a baby. Genes from you and your partner are forming the baby. The female genes determine whether the baby is a boy or a girl. At 6-8 weeks, the eyes and face are formed, and the heartbeat can be seen on ultrasound. At the end of 12 weeks, all the baby's organs are formed.  Now that you are pregnant, you will want to do everything you can to have a healthy baby. Two of the most important things are to get good prenatal care and to follow your health care provider's instructions. Prenatal care is all the medical care you receive before the baby's birth. This care will help prevent, find, and treat any problems during the pregnancy and childbirth. BODY CHANGES Your body goes through many changes during pregnancy. The changes vary from woman to woman.   You may gain or lose a couple of pounds at first.  You may feel sick to your stomach (nauseous) and throw up (vomit). If the vomiting is uncontrollable, call your health care provider.  You may tire easily.  You may develop headaches that can be relieved by medicines approved by your health care provider.  You may urinate more often. Painful urination may mean you have a bladder infection.  You may develop heartburn as a result of your pregnancy.  You may develop constipation because certain hormones are causing the muscles that push waste through your intestines to slow down.  You may develop hemorrhoids or swollen, bulging veins (varicose veins).  Your breasts may begin to grow larger and become tender. Your nipples may stick out more, and the tissue that surrounds them (areola) may become darker.  Your gums may bleed and may be sensitive to brushing and flossing.  Dark spots or blotches (chloasma,  mask of pregnancy) may develop on your face. This will likely fade after the baby is born.  Your menstrual periods will stop.  You may have a loss of appetite.  You may develop cravings for certain kinds of food.  You may have changes in your emotions from day to day, such as being excited to be pregnant or being concerned that something may go wrong with the pregnancy and baby.  You may have more vivid and strange dreams.  You may have changes in your hair. These can include thickening of your hair, rapid growth, and changes in texture. Some women also have hair loss during or after pregnancy, or hair that feels dry or thin. Your hair will most likely return to normal after your baby is born. WHAT TO EXPECT AT YOUR PRENATAL VISITS During a routine prenatal visit:  You will be weighed to make sure you and the baby are growing normally.  Your blood pressure will be taken.  Your abdomen will be measured to track your baby's growth.  The fetal heartbeat will be listened to starting around week 10 or 12 of your pregnancy.  Test results from any previous visits will be discussed. Your health care provider may ask you:  How you are feeling.  If you are feeling the baby move.  If you have had any abnormal symptoms, such as leaking fluid, bleeding, severe headaches, or abdominal cramping.  If you are using any tobacco products,   including cigarettes, chewing tobacco, and electronic cigarettes.  If you have any questions. Other tests that may be performed during your first trimester include:  Blood tests to find your blood type and to check for the presence of any previous infections. They will also be used to check for low iron levels (anemia) and Rh antibodies. Later in the pregnancy, blood tests for diabetes will be done along with other tests if problems develop.  Urine tests to check for infections, diabetes, or protein in the urine.  An ultrasound to confirm the proper growth  and development of the baby.  An amniocentesis to check for possible genetic problems.  Fetal screens for spina bifida and Down syndrome.  You may need other tests to make sure you and the baby are doing well.  HIV (human immunodeficiency virus) testing. Routine prenatal testing includes screening for HIV, unless you choose not to have this test. HOME CARE INSTRUCTIONS  Medicines  Follow your health care provider's instructions regarding medicine use. Specific medicines may be either safe or unsafe to take during pregnancy.  Take your prenatal vitamins as directed.  If you develop constipation, try taking a stool softener if your health care provider approves. Diet  Eat regular, well-balanced meals. Choose a variety of foods, such as meat or vegetable-based protein, fish, milk and low-fat dairy products, vegetables, fruits, and whole grain breads and cereals. Your health care provider will help you determine the amount of weight gain that is right for you.  Avoid raw meat and uncooked cheese. These carry germs that can cause birth defects in the baby.  Eating four or five small meals rather than three large meals a day may help relieve nausea and vomiting. If you start to feel nauseous, eating a few soda crackers can be helpful. Drinking liquids between meals instead of during meals also seems to help nausea and vomiting.  If you develop constipation, eat more high-fiber foods, such as fresh vegetables or fruit and whole grains. Drink enough fluids to keep your urine clear or pale yellow. Activity and Exercise  Exercise only as directed by your health care provider. Exercising will help you:  Control your weight.  Stay in shape.  Be prepared for labor and delivery.  Experiencing pain or cramping in the lower abdomen or low back is a good sign that you should stop exercising. Check with your health care provider before continuing normal exercises.  Try to avoid standing for long  periods of time. Move your legs often if you must stand in one place for a long time.  Avoid heavy lifting.  Wear low-heeled shoes, and practice good posture.  You may continue to have sex unless your health care provider directs you otherwise. Relief of Pain or Discomfort  Wear a good support bra for breast tenderness.   Take warm sitz baths to soothe any pain or discomfort caused by hemorrhoids. Use hemorrhoid cream if your health care provider approves.   Rest with your legs elevated if you have leg cramps or low back pain.  If you develop varicose veins in your legs, wear support hose. Elevate your feet for 15 minutes, 3-4 times a day. Limit salt in your diet. Prenatal Care  Schedule your prenatal visits by the twelfth week of pregnancy. They are usually scheduled monthly at first, then more often in the last 2 months before delivery.  Write down your questions. Take them to your prenatal visits.  Keep all your prenatal visits as directed by your   health care provider. Safety  Wear your seat belt at all times when driving.  Make a list of emergency phone numbers, including numbers for family, friends, the hospital, and police and fire departments. General Tips  Ask your health care provider for a referral to a local prenatal education class. Begin classes no later than at the beginning of month 6 of your pregnancy.  Ask for help if you have counseling or nutritional needs during pregnancy. Your health care provider can offer advice or refer you to specialists for help with various needs.  Do not use hot tubs, steam rooms, or saunas.  Do not douche or use tampons or scented sanitary pads.  Do not cross your legs for long periods of time.  Avoid cat litter boxes and soil used by cats. These carry germs that can cause birth defects in the baby and possibly loss of the fetus by miscarriage or stillbirth.  Avoid all smoking, herbs, alcohol, and medicines not prescribed by  your health care provider. Chemicals in these affect the formation and growth of the baby.  Do not use any tobacco products, including cigarettes, chewing tobacco, and electronic cigarettes. If you need help quitting, ask your health care provider. You may receive counseling support and other resources to help you quit.  Schedule a dentist appointment. At home, brush your teeth with a soft toothbrush and be gentle when you floss. SEEK MEDICAL CARE IF:   You have dizziness.  You have mild pelvic cramps, pelvic pressure, or nagging pain in the abdominal area.  You have persistent nausea, vomiting, or diarrhea.  You have a bad smelling vaginal discharge.  You have pain with urination.  You notice increased swelling in your face, hands, legs, or ankles. SEEK IMMEDIATE MEDICAL CARE IF:   You have a fever.  You are leaking fluid from your vagina.  You have spotting or bleeding from your vagina.  You have severe abdominal cramping or pain.  You have rapid weight gain or loss.  You vomit blood or material that looks like coffee grounds.  You are exposed to German measles and have never had them.  You are exposed to fifth disease or chickenpox.  You develop a severe headache.  You have shortness of breath.  You have any kind of trauma, such as from a fall or a car accident.   This information is not intended to replace advice given to you by your health care provider. Make sure you discuss any questions you have with your health care provider.   Document Released: 05/05/2001 Document Revised: 06/01/2014 Document Reviewed: 03/21/2013 [employer] Interactive Patient Education 2016 [employer]  

## 2015-10-22 NOTE — ED Provider Notes (Signed)
CSN: [id number]     Arrival date & time 10/22/15  2124 History  By signing my name below, I, Jasmyn B. Alexander, attest that this documentation has been prepared under the direction and in the presence of Jacalyn Lefevre, MD.  Electronically Signed: Gillis Ends. Lyn Hollingshead, ED Scribe. 10/22/2015. 9:41 PM.   Chief Complaint  Patient presents with  . Abdominal Pain   The history is provided by the patient. No language interpreter was used.    HPI Comments: Ashley Curtis is a 30 y.o. female who presents to the Emergency Department complaining of gradual onset, intermittent, lower abdominal pain that radiates to the back onset 2 days ago. Pt notes that abdominal pain feels similar to period cramps. She says she took two pregnancy tests earlier today, both showing positive results. Her last normal menstrual period was 09/13/15. There are no modifying factors. She denies any vaginal discharge, dysuria, burning, or hematuria.   Past Medical History  Diagnosis Date  . Anxiety   . Migraine   . Anemia    Past Surgical History  Procedure Laterality Date  . Appendectomy      6 yoa  . Cesarean section N/A 12/04/2012    Procedure: Primary CESAREAN SECTION of baby girl at 2143 APGAR 9/9;  Surgeon: Esmeralda Arthur, MD;  Location: [facility];  Service: Obstetrics;  Laterality: N/A;   Family History  Problem Relation Age of Onset  . Gestational diabetes Mother   . Seizures Brother    Social History  Substance Use Topics  . Smoking status: Current Every Day Smoker -- 1.00 packs/day    Types: Cigarettes  . Smokeless tobacco: Never Used  . Alcohol Use: Yes     Comment: Occasionally prior to pregnancy   OB History    Gravida Para Term Preterm AB TAB SAB Ectopic Multiple Living   1 1 1       1      Review of Systems  Gastrointestinal: Positive for abdominal pain.  Genitourinary: Negative for dysuria, hematuria and vaginal discharge.  All other systems reviewed and are negative.   Allergies  Review of  patient's allergies indicates no known allergies.  Home Medications   Prior to Admission medications   Medication Sig Start Date End Date Taking? Authorizing Provider  HYDROcodone-acetaminophen (NORCO) 5-325 MG per tablet Take 1 tablet by mouth every 6 (six) hours as needed for pain. 12/06/12   Haroldine Laws, CNM  iron polysaccharides (NIFEREX) 150 MG capsule Take 1 capsule (150 mg total) by mouth 2 (two) times daily. 12/06/12   Haroldine Laws, CNM  Prenatal Vit-Fe Fumarate-FA (PRENATAL COMPLETE) 14-0.4 MG TABS Take 1 tablet by mouth daily. 10/22/15   Jacalyn Lefevre, MD   BP 134/97 mmHg  Pulse 97  Temp(Src) 98.2 F (36.8 C) (Oral)  Resp 16  Ht 5\' 4"  (1.626 m)  Wt 130 lb (58.968 kg)  BMI 22.30 kg/m2  SpO2 100%  LMP 09/13/2015 Physical Exam  Constitutional: She is oriented to person, place, and time. She appears well-developed and well-nourished.  HENT:  Head: Normocephalic and atraumatic.  Eyes: EOM are normal. Pupils are equal, round, and reactive to light.  Neck: Normal range of motion.  Cardiovascular: Normal rate, regular rhythm and normal heart sounds.   Pulmonary/Chest: Effort normal and breath sounds normal. No respiratory distress. She has no wheezes. She has no rales.  Abdominal: Soft. Bowel sounds are normal. She exhibits no distension. There is no tenderness. There is no rebound and no guarding.  Genitourinary: Vagina  normal and uterus normal. Cervix exhibits no discharge. Right adnexum displays no tenderness. Left adnexum displays no tenderness.  Musculoskeletal: Normal range of motion. She exhibits tenderness.  Mild suprapubic tenderness  Neurological: She is alert and oriented to person, place, and time.  Skin: Skin is warm and dry. No rash noted.  Psychiatric: She has a normal mood and affect. Judgment normal.  Nursing note and vitals reviewed.   ED Course  Procedures (including critical care time) DIAGNOSTIC STUDIES: Oxygen Saturation is 100% on RA, normal by my  interpretation.    COORDINATION OF CARE: 9:41 PM-Discussed treatment plan which includes CBC panel and UA with pt at bedside and pt agreed to plan.   Labs Review Labs Reviewed  PREGNANCY, URINE - Abnormal; Notable for the following:    Preg Test, Ur POSITIVE (*)    All other components within normal limits  HCG, QUANTITATIVE, PREGNANCY - Abnormal; Notable for the following:    hCG, Beta Chain, Quant, S 480 (*)    All other components within normal limits  WET PREP, GENITAL  URINALYSIS, ROUTINE W REFLEX MICROSCOPIC (NOT AT [facility])  CBC WITH DIFFERENTIAL/PLATELET  LIPASE, BLOOD  BASIC METABOLIC PANEL  GC/CHLAMYDIA PROBE AMP ([place]) NOT AT [facility]    Imaging Review US Ob Comp Less 14 Wks  10/22/2015  CLINICAL DATA:  Acute onset of pelvic cramping.  Initial encounter. EXAM: OBSTETRIC <14 WK [place] AND TRANSVAGINAL OB US TECHNIQUE: Both transabdominal and transvaginal ultrasound examinations were performed for complete evaluation of the gestation as well as the maternal uterus, adnexal regions, and pelvic cul-de-sac. Transvaginal technique was performed to assess early pregnancy. COMPARISON:  None. FINDINGS: Intrauterine gestational sac: None seen. A tiny 0.2 cm nonspecific cystic focus is noted at the endometrial echo complex. Yolk sac:  N/A Embryo:  N/A Subchorionic hemorrhage:  None visualized. Maternal uterus/adnexae: The uterus is unremarkable in appearance. The ovaries are grossly unremarkable in appearance. The right ovary measures 3.3 x 2.3 x 2.2 cm, while the left ovary measures 3.6 x 2.3 x 2.2 cm. No suspicious adnexal masses are seen; there is no evidence for ovarian torsion. A small amount of free fluid is seen within the pelvic cul-de-sac. IMPRESSION: No definite intrauterine gestational sac seen at this time. No evidence for ectopic pregnancy. This remains within normal limits, given the quantitative beta HCG level of 480. If the quantitative beta HCG level continues to rise, follow-up  pelvic ultrasound could be performed in 2 weeks for further evaluation. Electronically Signed   By: Roanna Raider M.D.   On: 10/22/2015 23:00   US Ob Transvaginal  10/22/2015  CLINICAL DATA:  Acute onset of pelvic cramping.  Initial encounter. EXAM: OBSTETRIC <14 WK [place] AND TRANSVAGINAL OB US TECHNIQUE: Both transabdominal and transvaginal ultrasound examinations were performed for complete evaluation of the gestation as well as the maternal uterus, adnexal regions, and pelvic cul-de-sac. Transvaginal technique was performed to assess early pregnancy. COMPARISON:  None. FINDINGS: Intrauterine gestational sac: None seen. A tiny 0.2 cm nonspecific cystic focus is noted at the endometrial echo complex. Yolk sac:  N/A Embryo:  N/A Subchorionic hemorrhage:  None visualized. Maternal uterus/adnexae: The uterus is unremarkable in appearance. The ovaries are grossly unremarkable in appearance. The right ovary measures 3.3 x 2.3 x 2.2 cm, while the left ovary measures 3.6 x 2.3 x 2.2 cm. No suspicious adnexal masses are seen; there is no evidence for ovarian torsion. A small amount of free fluid is seen within the pelvic cul-de-sac. IMPRESSION: No  definite intrauterine gestational sac seen at this time. No evidence for ectopic pregnancy. This remains within normal limits, given the quantitative beta HCG level of 480. If the quantitative beta HCG level continues to rise, follow-up pelvic ultrasound could be performed in 2 weeks for further evaluation. Electronically Signed   By: Roanna RaiderJeffery  Chang M.D.   On: 10/22/2015 23:00   I have personally reviewed and evaluated these images and lab results as part of my medical decision-making.   EKG Interpretation None      MDM  Pt's quant is very low, so US showed no IUP, but no suspicious ovarian masses.  Pt told that she needs to f/u with ob.  She knows to return if worse. Final diagnoses:  Pregnancy    I personally performed the services described in this  documentation, which was scribed in my presence. The recorded information has been reviewed and is accurate.    Jacalyn LefevreJulie Milcah Dulany, MD 10/22/15 2308

## 2015-10-22 NOTE — ED Notes (Signed)
Pt c/o lower abd pain and back pain x 2 days , ? preg  LMP April 21 , home preg test positive

## 2015-10-24 LAB — GC/CHLAMYDIA PROBE AMP (~~LOC~~) NOT AT ARMC
CHLAMYDIA, DNA PROBE: NEGATIVE
NEISSERIA GONORRHEA: NEGATIVE

## 2015-11-15 ENCOUNTER — Inpatient Hospital Stay (HOSPITAL_COMMUNITY)
Admission: AD | Admit: 2015-11-15 | Discharge: 2015-11-15 | Disposition: A | Discharge status: Home / Self Care | Payer: Self-pay | Source: Ambulatory Visit | Attending: Family Medicine | Admitting: Family Medicine

## 2015-11-15 ENCOUNTER — Encounter (HOSPITAL_COMMUNITY): Payer: Self-pay | Admitting: Student

## 2015-11-15 ENCOUNTER — Inpatient Hospital Stay (HOSPITAL_COMMUNITY): Payer: Self-pay

## 2015-11-15 DIAGNOSIS — O26899 Other specified pregnancy related conditions, unspecified trimester: Secondary | ICD-10-CM

## 2015-11-15 DIAGNOSIS — O219 Vomiting of pregnancy, unspecified: Secondary | ICD-10-CM

## 2015-11-15 DIAGNOSIS — O210 Mild hyperemesis gravidarum: Secondary | ICD-10-CM | POA: Insufficient documentation

## 2015-11-15 DIAGNOSIS — Z3A01 Less than 8 weeks gestation of pregnancy: Secondary | ICD-10-CM | POA: Insufficient documentation

## 2015-11-15 DIAGNOSIS — Z3491 Encounter for supervision of normal pregnancy, unspecified, first trimester: Secondary | ICD-10-CM

## 2015-11-15 DIAGNOSIS — R109 Unspecified abdominal pain: Secondary | ICD-10-CM | POA: Insufficient documentation

## 2015-11-15 DIAGNOSIS — O99331 Smoking (tobacco) complicating pregnancy, first trimester: Secondary | ICD-10-CM | POA: Insufficient documentation

## 2015-11-15 DIAGNOSIS — F17210 Nicotine dependence, cigarettes, uncomplicated: Secondary | ICD-10-CM | POA: Insufficient documentation

## 2015-11-15 DIAGNOSIS — O9989 Other specified diseases and conditions complicating pregnancy, childbirth and the puerperium: Secondary | ICD-10-CM

## 2015-11-15 DIAGNOSIS — O3680X0 Pregnancy with inconclusive fetal viability, not applicable or unspecified: Secondary | ICD-10-CM

## 2015-11-15 LAB — CBC
HEMATOCRIT: 33.8 % — AB (ref 36.0–46.0)
Hemoglobin: 11.6 g/dL — ABNORMAL LOW (ref 12.0–15.0)
MCH: 29.7 pg (ref 26.0–34.0)
MCHC: 34.3 g/dL (ref 30.0–36.0)
MCV: 86.7 fL (ref 78.0–100.0)
Platelets: 240 10*3/uL (ref 150–400)
RBC: 3.9 MIL/uL (ref 3.87–5.11)
RDW: 12.8 % (ref 11.5–15.5)
WBC: 10.7 10*3/uL — AB (ref 4.0–10.5)

## 2015-11-15 LAB — URINALYSIS, ROUTINE W REFLEX MICROSCOPIC
Bilirubin Urine: NEGATIVE
Glucose, UA: NEGATIVE mg/dL
HGB URINE DIPSTICK: NEGATIVE
Ketones, ur: NEGATIVE mg/dL
Leukocytes, UA: NEGATIVE
Nitrite: NEGATIVE
Protein, ur: NEGATIVE mg/dL
SPECIFIC GRAVITY, URINE: 1.02 (ref 1.005–1.030)
pH: 6 (ref 5.0–8.0)

## 2015-11-15 LAB — HCG, QUANTITATIVE, PREGNANCY: hCG, Beta Chain, Quant, S: 96062 m[IU]/mL — ABNORMAL HIGH (ref <5)

## 2015-11-15 MED ORDER — PROMETHAZINE HCL 25 MG PO TABS: 25.0000 mg | ORAL_TABLET | Freq: Four times a day (QID) | ORAL | Status: DC | PRN

## 2015-11-15 NOTE — Discharge Instructions (Signed)
First Trimester of Pregnancy °The first trimester of pregnancy is from week 1 until the end of week 12 (months 1 through 3). During this time, your baby will begin to develop inside you. At 6-8 weeks, the eyes and face are formed, and the heartbeat can be seen on ultrasound. At the end of 12 weeks, all the baby's organs are formed. Prenatal care is all the medical care you receive before the birth of your baby. Make sure you get good prenatal care and follow all of your doctor's instructions. °HOME CARE  °Medicines °· Take medicine only as told by your doctor. Some medicines are safe and some are not during pregnancy. °· Take your prenatal vitamins as told by your doctor. °· Take medicine that helps you poop (stool softener) as needed if your doctor says it is okay. °Diet °· Eat regular, healthy meals. °· Your doctor will tell you the amount of weight gain that is right for you. °· Avoid raw meat and uncooked cheese. °· If you feel sick to your stomach (nauseous) or throw up (vomit): °· Eat 4 or 5 small meals a day instead of 3 large meals. °· Try eating a few soda crackers. °· Drink liquids between meals instead of during meals. °· If you have a hard time pooping (constipation): °· Eat high-fiber foods like fresh vegetables, fruit, and whole grains. °· Drink enough fluids to keep your pee (urine) clear or pale yellow. °Activity and Exercise °· Exercise only as told by your doctor. Stop exercising if you have cramps or pain in your lower belly (abdomen) or low back. °· Try to avoid standing for long periods of time. Move your legs often if you must stand in one place for a long time. °· Avoid heavy lifting. °· Wear low-heeled shoes. Sit and stand up straight. °· You can have sex unless your doctor tells you not to. °Relief of Pain or Discomfort °· Wear a good support bra if your breasts are sore. °· Take warm water baths (sitz baths) to soothe pain or discomfort caused by hemorrhoids. Use hemorrhoid cream if your  doctor says it is okay. °· Rest with your legs raised if you have leg cramps or low back pain. °· Wear support hose if you have puffy, bulging veins (varicose veins) in your legs. Raise (elevate) your feet for 15 minutes, 3-4 times a day. Limit salt in your diet. °Prenatal Care °· Schedule your prenatal visits by the twelfth week of pregnancy. °· Write down your questions. Take them to your prenatal visits. °· Keep all your prenatal visits as told by your doctor. °Safety °· Wear your seat belt at all times when driving. °· Make a list of emergency phone numbers. The list should include numbers for family, friends, the hospital, and police and fire departments. °General Tips °· Ask your doctor for a referral to a local prenatal class. Begin classes no later than at the start of month 6 of your pregnancy. °· Ask for help if you need counseling or help with nutrition. Your doctor can give you advice or tell you where to go for help. °· Do not use hot tubs, steam rooms, or saunas. °· Do not douche or use tampons or scented sanitary pads. °· Do not cross your legs for long periods of time. °· Avoid litter boxes and soil used by cats. °· Avoid all smoking, herbs, and alcohol. Avoid drugs not approved by your doctor. °· Do not use any tobacco products, including cigarettes,   chewing tobacco, and electronic cigarettes. If you need help quitting, ask your doctor. You may get counseling or other support to help you quit.  Visit your dentist. At home, brush your teeth with a soft toothbrush. Be gentle when you floss. GET HELP IF:  You are dizzy.  You have mild cramps or pressure in your lower belly.  You have a nagging pain in your belly area.  You continue to feel sick to your stomach, throw up, or have watery poop (diarrhea).  You have a bad smelling fluid coming from your vagina.  You have pain with peeing (urination).  You have increased puffiness (swelling) in your face, hands, legs, or ankles. GET HELP  RIGHT AWAY IF:   You have a fever.  You are leaking fluid from your vagina.  You have spotting or bleeding from your vagina.  You have very bad belly cramping or pain.  You gain or lose weight rapidly.  You throw up blood. It may look like coffee grounds.  You are around people who have [place] measles, fifth disease, or chickenpox.  You have a very bad headache.  You have shortness of breath.  You have any kind of trauma, such as from a fall or a car accident.   This information is not intended to replace advice given to you by your health care provider. Make sure you discuss any questions you have with your health care provider.   Document Released: 10/28/2007 Document Revised: 06/01/2014 Document Reviewed: 03/21/2013 Elsevier Interactive Patient Education 2016 [employer] Inc[employer]    Abdominal Pain During Pregnancy Belly (abdominal) pain is common during pregnancy. Most of the time, it is not a serious problem. Other times, it can be a sign that something is wrong with the pregnancy. Always tell your doctor if you have belly pain. HOME CARE Monitor your belly pain for any changes. The following actions may help you feel better:  Do not have sex (intercourse) or put anything in your vagina until you feel better.  Rest until your pain stops.  Drink clear fluids if you feel sick to your stomach (nauseous). Do not eat solid food until you feel better.  Only take medicine as told by your doctor.  Keep all doctor visits as told. GET HELP RIGHT AWAY IF:   You are bleeding, leaking fluid, or pieces of tissue come out of your vagina.  You have more pain or cramping.  You keep throwing up (vomiting).  You have pain when you pee (urinate) or have blood in your pee.  You have a fever.  You do not feel your baby moving as much.  You feel very weak or feel like passing out.  You have trouble breathing, with or without belly pain.  You have a very bad headache and belly  pain.  You have fluid leaking from your vagina and belly pain.  You keep having watery poop (diarrhea).  Your belly pain does not go away after resting, or the pain gets worse. MAKE SURE YOU:   Understand these instructions.  Will watch your condition.  Will get help right away if you are not doing well or get worse.   This information is not intended to replace advice given to you by your health care provider. Make sure you discuss any questions you have with your health care provider.   Document Released: 04/29/2009 Document Revised: 01/11/2013 Document Reviewed: 12/08/2012 Elsevier Interactive Patient Education 2016 Elsevier Inc.     Morning Sickness Morning sickness is  when you feel sick to your stomach (nauseous) during pregnancy. You may feel sick to your stomach and throw up (vomit). You may feel sick in the morning, but you can feel this way any time of day. Some women feel very sick to their stomach and cannot stop throwing up (hyperemesis gravidarum). HOME CARE  Only take medicines as told by your doctor.  Take multivitamins as told by your doctor. Taking multivitamins before getting pregnant can stop or lessen the harshness of morning sickness.  Eat dry toast or unsalted crackers before getting out of bed.  Eat 5 to 6 small meals a day.  Eat dry and bland foods like rice and baked potatoes.  Do not drink liquids with meals. Drink between meals.  Do not eat greasy, fatty, or spicy foods.  Have someone cook for you if the smell of food causes you to feel sick or throw up.  If you feel sick to your stomach after taking prenatal vitamins, take them at night or with a snack.  Eat protein when you need a snack (nuts, yogurt, cheese).  Eat unsweetened gelatins for dessert.  Wear a bracelet used for sea sickness (acupressure wristband).  Go to a doctor that puts thin needles into certain body points (acupuncture) to improve how you feel.  Do not smoke.  Use  a humidifier to keep the air in your house free of odors.  Get lots of fresh air. GET HELP IF:  You need medicine to feel better.  You feel dizzy or lightheaded.  You are losing weight. GET HELP RIGHT AWAY IF:   You feel very sick to your stomach and cannot stop throwing up.  You pass out (faint). MAKE SURE YOU:  Understand these instructions.  Will watch your condition.  Will get help right away if you are not doing well or get worse.   This information is not intended to replace advice given to you by your health care provider. Make sure you discuss any questions you have with your health care provider.   Document Released: 06/18/2004 Document Revised: 06/01/2014 Document Reviewed: 10/26/2012 Elsevier Interactive Patient Education 2016 [employer].    Safe Medications in Pregnancy   Acne: Benzoyl Peroxide Salicylic Acid  Backache/Headache: Tylenol: 2 regular strength every 4 hours OR              2 Extra strength every 6 hours  Colds/Coughs/Allergies: Benadryl (alcohol free) 25 mg every 6 hours as needed Breath right strips Claritin Cepacol throat lozenges Chloraseptic throat spray Cold-Eeze- up to three times per day Cough drops, alcohol free Flonase (by prescription only) Guaifenesin Mucinex Robitussin DM (plain only, alcohol free) Saline nasal spray/drops Sudafed (pseudoephedrine) & Actifed ** use only after [redacted] weeks gestation and if you do not have high blood pressure Tylenol Vicks Vaporub Zinc lozenges Zyrtec   Constipation: Colace Ducolax suppositories Fleet enema Glycerin suppositories Metamucil Milk of magnesia Miralax Senokot Smooth move tea  Diarrhea: Kaopectate Imodium A-D  *NO pepto Bismol  Hemorrhoids: Anusol Anusol HC Preparation H Tucks  Indigestion: Tums Maalox Mylanta Zantac  Pepcid  Insomnia: Benadryl (alcohol free)  every 6 hours as needed Tylenol PM Unisom, no Gelcaps  Leg  Cramps: Tums MagGel  Nausea/Vomiting:  Bonine Dramamine Emetrol Ginger extract Sea bands Meclizine  Nausea medication to take during pregnancy:  Unisom (doxylamine succinate 25 mg tablets) Take one tablet daily at bedtime. If symptoms are not adequately controlled, the dose can be increased to a maximum recommended dose of two  tablets daily (1/2 tablet in the morning, 1/2 tablet mid-afternoon and one at bedtime). Vitamin B6 100mg  tablets. Take one tablet twice a day (up to 200 mg per day).  Skin Rashes: Aveeno products Benadryl cream or 25mg  every 6 hours as needed Calamine Lotion 1% cortisone cream  Yeast infection: Gyne-lotrimin 7 Monistat 7  Gum/tooth pain: Anbesol  **If taking multiple medications, please check labels to avoid duplicating the same active ingredients **take medication as directed on the label ** Do not exceed 4000 mg of tylenol in 24 hours **Do not take medications that contain aspirin or ibuprofen

## 2015-11-15 NOTE — MAU Provider Note (Signed)
History     CSN: [id number]  Arrival date and time: 11/15/15 1443   First Provider Initiated Contact with Patient 11/15/15 1532        Chief Complaint  Patient presents with  . Abdominal Pain  . Fatigue   HPI  Ashley Curtis is a 30 y.o. G2P1001 at 4845w4d who presents with abdominal pain. Symptoms began 2 days ago. Reports lower abdominal pain that comes & goes. Feels several times per day. Describes as sharp. Rates 3/10. Has not treated.  Some nausea; no vomiting/diarrhea/constipation. Denies vaginal bleeding, vaginal discharge, dysuria, fever.  Also reports feeling "very tired all of the time". Reports history of anemia with previous pregnancy.  Seen in ED 3 weeks ago -- BHCG 480, ultrasound showed no IUP or adnexal mass. GC/CT & wet prep negative.  [facility]  OB History    Gravida Para Term Preterm AB TAB SAB Ectopic Multiple Living   2 1 1       1       Past Medical History  Diagnosis Date  . Anxiety   . Migraine   . Anemia     Past Surgical History  Procedure Laterality Date  . Appendectomy      6 yoa  . Cesarean section N/A 12/04/2012    Procedure: Primary CESAREAN SECTION of baby girl at 2143 APGAR 9/9;  Surgeon: Esmeralda ArthurSandra A Rivard, MD;  Location: [facility];  Service: Obstetrics;  Laterality: N/A;    Family History  Problem Relation Age of Onset  . Gestational diabetes Mother   . Seizures Brother     Social History  Substance Use Topics  . Smoking status: Current Every Day Smoker -- 1.00 packs/day    Types: Cigarettes  . Smokeless tobacco: Never Used  . Alcohol Use: Yes     Comment: Occasionally prior to pregnancy    Allergies: No Known Allergies  Prescriptions prior to admission  Medication Sig Dispense Refill Last Dose  . HYDROcodone-acetaminophen (NORCO) 5-325 MG per tablet Take 1 tablet by mouth every 6 (six) hours as needed for pain. 30 tablet 0 Unknown at Unknown time  . iron polysaccharides (NIFEREX) 150 MG  capsule Take 1 capsule (150 mg total) by mouth 2 (two) times daily. 60 capsule 6 Unknown at Unknown time  . Prenatal Vit-Fe Fumarate-FA (PRENATAL COMPLETE) 14-0.4 MG TABS Take 1 tablet by mouth daily. 60 each 0     Review of Systems  Constitutional: Positive for malaise/fatigue. Negative for fever, chills and weight loss.  Gastrointestinal: Positive for nausea and abdominal pain. Negative for vomiting, diarrhea, constipation and blood in stool.  Genitourinary: Negative.   Neurological: Negative for dizziness.   Physical Exam   Blood pressure 118/62, pulse 91, temperature 98.2 F (36.8 C), temperature source Oral, resp. rate 16, height 5\' 5"  (1.651 m), weight 150 lb 3.2 oz (68.13 kg), last menstrual period 09/13/2015, currently breastfeeding.  Physical Exam  Nursing note and vitals reviewed. Constitutional: She is oriented to person, place, and time. She appears well-developed and well-nourished. No distress.  HENT:  Head: Normocephalic and atraumatic.  Eyes: Conjunctivae are normal. Right eye exhibits no discharge. Left eye exhibits no discharge. No scleral icterus.  Neck: Normal range of motion.  Cardiovascular: Normal rate, regular rhythm and normal heart sounds.   No murmur heard. Respiratory: Effort normal and breath sounds normal. No respiratory distress. She has no wheezes.  GI: Soft. Bowel sounds are normal. She exhibits no distension. There is  no tenderness. There is no rebound and no guarding.  Neurological: She is alert and oriented to person, place, and time.  Skin: Skin is warm and dry. She is not diaphoretic.  Psychiatric: She has a normal mood and affect. Her behavior is normal. Judgment and thought content normal.    MAU Course  Procedures Results for orders placed or performed during the hospital encounter of 11/15/15 (from the past 24 hour(s))  Urinalysis, Routine w reflex microscopic (not at [facility])     Status: None   Collection Time: 11/15/15  2:50 PM  Result  Value Ref Range   Color, Urine YELLOW YELLOW   APPearance CLEAR CLEAR   Specific Gravity, Urine 1.020 1.005 - 1.030   pH 6.0 5.0 - 8.0   Glucose, UA NEGATIVE NEGATIVE mg/dL   Hgb urine dipstick NEGATIVE NEGATIVE   Bilirubin Urine NEGATIVE NEGATIVE   Ketones, ur NEGATIVE NEGATIVE mg/dL   Protein, ur NEGATIVE NEGATIVE mg/dL   Nitrite NEGATIVE NEGATIVE   Leukocytes, UA NEGATIVE NEGATIVE  CBC     Status: Abnormal   Collection Time: 11/15/15  4:06 PM  Result Value Ref Range   WBC 10.7 (H) 4.0 - 10.5 K/uL   RBC 3.90 3.87 - 5.11 MIL/uL   Hemoglobin 11.6 (L) 12.0 - 15.0 g/dL   HCT 16.133.8 (L) 09.636.0 - 04.546.0 %   MCV 86.7 78.0 - 100.0 fL   MCH 29.7 26.0 - 34.0 pg   MCHC 34.3 30.0 - 36.0 g/dL   RDW 40.912.8 81.111.5 - 91.415.5 %   Platelets 240 150 - 400 K/uL  hCG, quantitative, pregnancy     Status: Abnormal   Collection Time: 11/15/15  4:06 PM  Result Value Ref Range   hCG, Beta Chain, Quant, S [id number] (H) <5 mIU/mL   [place] Ob Transvaginal  11/15/2015  CLINICAL DATA:  Abdominal pain during pregnancy. Pregnancy of unknown anatomic location. Gestational age by LMP of 8 weeks 6 days. EXAM: TRANSVAGINAL OB ULTRASOUND TECHNIQUE: Transvaginal ultrasound was performed for complete evaluation of the gestation as well as the maternal uterus, adnexal regions, and pelvic cul-de-sac. COMPARISON:  10/22/2015 FINDINGS: Intrauterine gestational sac: Single Yolk sac:  Visualized Embryo:  Visualized Cardiac Activity: Visualized Heart Rate: 159 bpm CRL:   13  mm   7 w 4 d                  US EDC: 06/29/2016 Subchorionic hemorrhage:  None visualized. Maternal uterus/adnexae: Both ovaries are normal in appearance. No masses or abnormal free fluid visualized. IMPRESSION: Single living IUP measuring 7 weeks 4 days, with US EDC of 06/29/2016. No significant maternal uterine or adnexal abnormality identified. Electronically Signed   By: Myles RosenthalJohn  Stahl M.D.   On: 11/15/2015 16:51    MDM B positive Ultrasound shows SIUP with cardiac  acitivity  Assessment and Plan  A: 1. Normal IUP (intrauterine pregnancy) on prenatal ultrasound, first trimester   2. Pregnancy of unknown anatomic location   3. Abdominal pain during pregnancy   4. Nausea and vomiting during pregnancy prior to [redacted] weeks gestation     P: Discharge home Rx phenergan Discussed reasons to return to MAU Keep ob appt  Judeth Hornrin Niemah Schwebke 11/15/2015, 3:32 PM

## 2015-11-15 NOTE — MAU Note (Signed)
Patient presents with abdominal cramping x 2 days, had pulling sensation on right side, now like period cramping, no vaginal bleeding, having a lot of nausea..Marland Kitchen

## 2015-12-04 ENCOUNTER — Telehealth: Payer: Self-pay | Admitting: *Deleted

## 2015-12-04 DIAGNOSIS — O219 Vomiting of pregnancy, unspecified: Secondary | ICD-10-CM

## 2015-12-04 MED ORDER — PROMETHAZINE HCL 25 MG PO TABS: 25.0000 mg | ORAL_TABLET | Freq: Four times a day (QID) | ORAL | Status: DC | PRN

## 2015-12-04 NOTE — Telephone Encounter (Signed)
Pt complaining of nausea and vomiting all day. She never got her rx for phenergan. Rx sent to her pharmacy. She has first visit next week.

## 2015-12-09 ENCOUNTER — Inpatient Hospital Stay (HOSPITAL_COMMUNITY)
Admission: AD | Admit: 2015-12-09 | Discharge: 2015-12-09 | Disposition: A | Discharge status: Home / Self Care | Payer: Self-pay | Source: Ambulatory Visit | Attending: Obstetrics and Gynecology | Admitting: Obstetrics and Gynecology

## 2015-12-09 ENCOUNTER — Encounter (HOSPITAL_COMMUNITY): Payer: Self-pay | Admitting: *Deleted

## 2015-12-09 DIAGNOSIS — F419 Anxiety disorder, unspecified: Secondary | ICD-10-CM | POA: Insufficient documentation

## 2015-12-09 DIAGNOSIS — O99351 Diseases of the nervous system complicating pregnancy, first trimester: Secondary | ICD-10-CM | POA: Insufficient documentation

## 2015-12-09 DIAGNOSIS — O218 Other vomiting complicating pregnancy: Secondary | ICD-10-CM | POA: Insufficient documentation

## 2015-12-09 DIAGNOSIS — Z87891 Personal history of nicotine dependence: Secondary | ICD-10-CM | POA: Insufficient documentation

## 2015-12-09 DIAGNOSIS — O219 Vomiting of pregnancy, unspecified: Secondary | ICD-10-CM

## 2015-12-09 DIAGNOSIS — Z3A11 11 weeks gestation of pregnancy: Secondary | ICD-10-CM | POA: Insufficient documentation

## 2015-12-09 DIAGNOSIS — G43909 Migraine, unspecified, not intractable, without status migrainosus: Secondary | ICD-10-CM | POA: Insufficient documentation

## 2015-12-09 DIAGNOSIS — O99341 Other mental disorders complicating pregnancy, first trimester: Secondary | ICD-10-CM | POA: Insufficient documentation

## 2015-12-09 LAB — URINALYSIS, ROUTINE W REFLEX MICROSCOPIC
BILIRUBIN URINE: NEGATIVE
Glucose, UA: NEGATIVE mg/dL
Hgb urine dipstick: NEGATIVE
KETONES UR: NEGATIVE mg/dL
Leukocytes, UA: NEGATIVE
NITRITE: NEGATIVE
Protein, ur: NEGATIVE mg/dL
Specific Gravity, Urine: 1.025 (ref 1.005–1.030)
pH: 6.5 (ref 5.0–8.0)

## 2015-12-09 MED ORDER — PROMETHAZINE HCL 25 MG PO TABS: 25.0000 mg | ORAL_TABLET | Freq: Every evening | ORAL | Status: DC | PRN

## 2015-12-09 MED ORDER — METOCLOPRAMIDE HCL 10 MG PO TABS: 10.0000 mg | ORAL_TABLET | Freq: Three times a day (TID) | ORAL | Status: DC

## 2015-12-09 NOTE — MAU Note (Signed)
States has had nausea/vomiting since beginning of pregnancy. Was given Rx, but was advised by pharmacist not to take because she is still breastfeeding her other child.

## 2015-12-09 NOTE — MAU Provider Note (Signed)
History     CSN: [id number]  Arrival date and time: 12/09/15 1419   First Provider Initiated Contact with Patient 12/09/15 1503      Chief Complaint  Patient presents with  . Morning Sickness   HPI   Ashley Curtis is a 30 y.o., G2P1001, 5443w0d who presents with nausea and vomiting. The N/V has been going on for the past 3 weeks and lasts all day long, and is even waking her up at night. She tries to eat frequent small meals but states all food and fluids come back up. She reports dizziness at times, and some lower back and abdominal pain. She also endorses fatigue, headache and chills throughout this pregnancy. She denies vaginal bleeding constipation or diarrhea, weight loss, shortness of breath, chest pain, heartburn, cough, or sore throat. She was previously prescribed Phenergan and Unisom but hasn't been taking them regularly due to making her too sleepy.   She is here with requests for nausea medication.   OB History    Gravida Para Term Preterm AB TAB SAB Ectopic Multiple Living   2 1 1       1       Past Medical History  Diagnosis Date  . Anxiety   . Migraine   . Anemia     Past Surgical History  Procedure Laterality Date  . Appendectomy      6 yoa  . Cesarean section N/A 12/04/2012    Procedure: Primary CESAREAN SECTION of baby girl at 2143 APGAR 9/9;  Surgeon: Esmeralda ArthurSandra A Rivard, MD;  Location: [facility];  Service: Obstetrics;  Laterality: N/A;    Family History  Problem Relation Age of Onset  . Gestational diabetes Mother   . Seizures Brother     Social History  Substance Use Topics  . Smoking status: Former Smoker -- 1.00 packs/day    Types: Cigarettes    Quit date: 10/28/2015  . Smokeless tobacco: Never Used  . Alcohol Use: Yes     Comment: Occasionally prior to pregnancy    Allergies: No Known Allergies  Prescriptions prior to admission  Medication Sig Dispense Refill Last Dose  . doxylamine, Sleep, (UNISOM) 25 MG tablet Take 12.5 mg by mouth at bedtime  as needed.   Past Week at Unknown time  . Prenatal Vit-Fe Fumarate-FA (PRENATAL COMPLETE) 14-0.4 MG TABS Take 1 tablet by mouth daily. 60 each 0 Past Month at Unknown time  . promethazine (PHENERGAN) 25 MG tablet Take 1 tablet (25 mg total) by mouth every 6 (six) hours as needed for nausea or vomiting. 30 tablet 0 Past Week at Unknown time  . promethazine (PHENERGAN) 25 MG tablet Take 1 tablet (25 mg total) by mouth every 6 (six) hours as needed for nausea or vomiting. (Patient not taking: Reported on 12/09/2015) 30 tablet 1 Not Taking at Unknown time   Results for orders placed or performed during the hospital encounter of 12/09/15 (from the past 48 hour(s))  Urinalysis, Routine w reflex microscopic ([facility] at [facility])     Status: Abnormal   Collection Time: 12/09/15  2:34 PM  Result Value Ref Range   Color, Urine YELLOW YELLOW   APPearance HAZY (A) CLEAR   Specific Gravity, Urine 1.025 1.005 - 1.030   pH 6.5 5.0 - 8.0   Glucose, UA NEGATIVE NEGATIVE mg/dL   Hgb urine dipstick NEGATIVE NEGATIVE   Bilirubin Urine NEGATIVE NEGATIVE   Ketones, ur NEGATIVE NEGATIVE mg/dL   Protein, ur NEGATIVE NEGATIVE mg/dL   Nitrite NEGATIVE  NEGATIVE   Leukocytes, UA NEGATIVE NEGATIVE    Comment: MICROSCOPIC NOT DONE ON URINES WITH NEGATIVE PROTEIN, BLOOD, LEUKOCYTES, NITRITE, OR GLUCOSE <1000 mg/dL.    Review of Systems  Constitutional: Positive for chills (throughout pregnancy) and malaise/fatigue (throughout pregnancy). Negative for fever and weight loss.  Respiratory: Negative for shortness of breath.   Cardiovascular: Negative for chest pain.  Gastrointestinal: Positive for nausea, vomiting and abdominal pain (lower abd cramping). Negative for heartburn, diarrhea and constipation.  Musculoskeletal: Positive for back pain.  Neurological: Positive for dizziness and headaches (throughout pregnancy).   Physical Exam   Blood pressure 126/80, pulse 66, temperature 98.2 F (36.8 C), temperature source Oral,  resp. rate 16, height 5\' 5"  (1.651 m), weight 152 lb (68.947 kg), last menstrual period 09/13/2015, currently breastfeeding.  Physical Exam  Constitutional: She is oriented to person, place, and time. She appears well-developed and well-nourished. No distress.  HENT:  Head: Normocephalic.  Eyes: Pupils are equal, round, and reactive to light.  Respiratory: Effort normal.  GI: Soft. She exhibits no distension and no mass. There is no tenderness. There is no rebound and no guarding.  Musculoskeletal: Normal range of motion.  Neurological: She is alert and oriented to person, place, and time.  Skin: Skin is warm. She is not diaphoretic.  Psychiatric: Her behavior is normal.    MAU Course  Procedures None  MDM  + fetal heart tones via doppler  UA with signs of severe dehydration.   Assessment and Plan   A:  1. Nausea and vomiting during pregnancy      P:  Discharge home in stable condition Rx: Reglan TID, phenergan at bedtime Return to MAU if symptoms worsen First trimester precautions discussed Small, frequent meals    Duane Lope, NP 12/09/2015 8:21 PM

## 2015-12-09 NOTE — Discharge Instructions (Signed)

## 2015-12-12 ENCOUNTER — Encounter: Payer: Self-pay | Admitting: Student

## 2015-12-12 ENCOUNTER — Ambulatory Visit (INDEPENDENT_AMBULATORY_CARE_PROVIDER_SITE_OTHER): Payer: Self-pay | Admitting: Family Medicine

## 2015-12-12 VITALS — BP 113/81 | HR 84 | Wt 152.0 lb

## 2015-12-12 DIAGNOSIS — Z3491 Encounter for supervision of normal pregnancy, unspecified, first trimester: Secondary | ICD-10-CM

## 2015-12-12 DIAGNOSIS — Z3480 Encounter for supervision of other normal pregnancy, unspecified trimester: Secondary | ICD-10-CM | POA: Insufficient documentation

## 2015-12-12 DIAGNOSIS — O09291 Supervision of pregnancy with other poor reproductive or obstetric history, first trimester: Secondary | ICD-10-CM

## 2015-12-12 DIAGNOSIS — Z3481 Encounter for supervision of other normal pregnancy, first trimester: Secondary | ICD-10-CM

## 2015-12-12 DIAGNOSIS — O34219 Maternal care for unspecified type scar from previous cesarean delivery: Secondary | ICD-10-CM

## 2015-12-12 DIAGNOSIS — Z8632 Personal history of gestational diabetes: Secondary | ICD-10-CM

## 2015-12-12 LAB — POCT URINALYSIS DIP (DEVICE)
BILIRUBIN URINE: NEGATIVE
Glucose, UA: NEGATIVE mg/dL
HGB URINE DIPSTICK: NEGATIVE
Ketones, ur: NEGATIVE mg/dL
Leukocytes, UA: NEGATIVE
NITRITE: NEGATIVE
PH: 7 (ref 5.0–8.0)
PROTEIN: NEGATIVE mg/dL
Specific Gravity, Urine: 1.025 (ref 1.005–1.030)
UROBILINOGEN UA: 1 mg/dL (ref 0.0–1.0)

## 2015-12-12 MED ORDER — PRENATAL GUMMIES/DHA & FA 0.4-32.5 MG PO CHEW: 1.0000 | CHEWABLE_TABLET | Freq: Every day | ORAL | Status: DC

## 2015-12-12 NOTE — Patient Instructions (Signed)
First Trimester of Pregnancy The first trimester of pregnancy is from week 1 until the end of week 12 (months 1 through 3). A week after a sperm fertilizes an egg, the egg will implant on the wall of the uterus. This embryo will begin to develop into a baby. Genes from you and your partner are forming the baby. The female genes determine whether the baby is a boy or a girl. At 6-8 weeks, the eyes and face are formed, and the heartbeat can be seen on ultrasound. At the end of 12 weeks, all the baby's organs are formed.  Now that you are pregnant, you will want to do everything you can to have a healthy baby. Two of the most important things are to get good prenatal care and to follow your health care provider's instructions. Prenatal care is all the medical care you receive before the baby's birth. This care will help prevent, find, and treat any problems during the pregnancy and childbirth. BODY CHANGES Your body goes through many changes during pregnancy. The changes vary from woman to woman.   You may gain or lose a couple of pounds at first.  You may feel sick to your stomach (nauseous) and throw up (vomit). If the vomiting is uncontrollable, call your health care provider.  You may tire easily.  You may develop headaches that can be relieved by medicines approved by your health care provider.  You may urinate more often. Painful urination may mean you have a bladder infection.  You may develop heartburn as a result of your pregnancy.  You may develop constipation because certain hormones are causing the muscles that push waste through your intestines to slow down.  You may develop hemorrhoids or swollen, bulging veins (varicose veins).  Your breasts may begin to grow larger and become tender. Your nipples may stick out more, and the tissue that surrounds them (areola) may become darker.  Your gums may bleed and may be sensitive to brushing and flossing.  Dark spots or blotches (chloasma,  mask of pregnancy) may develop on your face. This will likely fade after the baby is born.  Your menstrual periods will stop.  You may have a loss of appetite.  You may develop cravings for certain kinds of food.  You may have changes in your emotions from day to day, such as being excited to be pregnant or being concerned that something may go wrong with the pregnancy and baby.  You may have more vivid and strange dreams.  You may have changes in your hair. These can include thickening of your hair, rapid growth, and changes in texture. Some women also have hair loss during or after pregnancy, or hair that feels dry or thin. Your hair will most likely return to normal after your baby is born. WHAT TO EXPECT AT YOUR PRENATAL VISITS During a routine prenatal visit:  You will be weighed to make sure you and the baby are growing normally.  Your blood pressure will be taken.  Your abdomen will be measured to track your baby's growth.  The fetal heartbeat will be listened to starting around week 10 or 12 of your pregnancy.  Test results from any previous visits will be discussed. Your health care provider may ask you:  How you are feeling.  If you are feeling the baby move.  If you have had any abnormal symptoms, such as leaking fluid, bleeding, severe headaches, or abdominal cramping.  If you are using any tobacco products,   including cigarettes, chewing tobacco, and electronic cigarettes.  If you have any questions. Other tests that may be performed during your first trimester include:  Blood tests to find your blood type and to check for the presence of any previous infections. They will also be used to check for low iron levels (anemia) and Rh antibodies. Later in the pregnancy, blood tests for diabetes will be done along with other tests if problems develop.  Urine tests to check for infections, diabetes, or protein in the urine.  An ultrasound to confirm the proper growth  and development of the baby.  An amniocentesis to check for possible genetic problems.  Fetal screens for spina bifida and Down syndrome.  You may need other tests to make sure you and the baby are doing well.  HIV (human immunodeficiency virus) testing. Routine prenatal testing includes screening for HIV, unless you choose not to have this test. HOME CARE INSTRUCTIONS  Medicines  Follow your health care provider's instructions regarding medicine use. Specific medicines may be either safe or unsafe to take during pregnancy.  Take your prenatal vitamins as directed.  If you develop constipation, try taking a stool softener if your health care provider approves. Diet  Eat regular, well-balanced meals. Choose a variety of foods, such as meat or vegetable-based protein, fish, milk and low-fat dairy products, vegetables, fruits, and whole grain breads and cereals. Your health care provider will help you determine the amount of weight gain that is right for you.  Avoid raw meat and uncooked cheese. These carry germs that can cause birth defects in the baby.  Eating four or five small meals rather than three large meals a day may help relieve nausea and vomiting. If you start to feel nauseous, eating a few soda crackers can be helpful. Drinking liquids between meals instead of during meals also seems to help nausea and vomiting.  If you develop constipation, eat more high-fiber foods, such as fresh vegetables or fruit and whole grains. Drink enough fluids to keep your urine clear or pale yellow. Activity and Exercise  Exercise only as directed by your health care provider. Exercising will help you:  Control your weight.  Stay in shape.  Be prepared for labor and delivery.  Experiencing pain or cramping in the lower abdomen or low back is a good sign that you should stop exercising. Check with your health care provider before continuing normal exercises.  Try to avoid standing for long  periods of time. Move your legs often if you must stand in one place for a long time.  Avoid heavy lifting.  Wear low-heeled shoes, and practice good posture.  You may continue to have sex unless your health care provider directs you otherwise. Relief of Pain or Discomfort  Wear a good support bra for breast tenderness.   Take warm sitz baths to soothe any pain or discomfort caused by hemorrhoids. Use hemorrhoid cream if your health care provider approves.   Rest with your legs elevated if you have leg cramps or low back pain.  If you develop varicose veins in your legs, wear support hose. Elevate your feet for 15 minutes, 3-4 times a day. Limit salt in your diet. Prenatal Care  Schedule your prenatal visits by the twelfth week of pregnancy. They are usually scheduled monthly at first, then more often in the last 2 months before delivery.  Write down your questions. Take them to your prenatal visits.  Keep all your prenatal visits as directed by your   health care provider. Safety  Wear your seat belt at all times when driving.  Make a list of emergency phone numbers, including numbers for family, friends, the hospital, and police and fire departments. General Tips  Ask your health care provider for a referral to a local prenatal education class. Begin classes no later than at the beginning of month 6 of your pregnancy.  Ask for help if you have counseling or nutritional needs during pregnancy. Your health care provider can offer advice or refer you to specialists for help with various needs.  Do not use hot tubs, steam rooms, or saunas.  Do not douche or use tampons or scented sanitary pads.  Do not cross your legs for long periods of time.  Avoid cat litter boxes and soil used by cats. These carry germs that can cause birth defects in the baby and possibly loss of the fetus by miscarriage or stillbirth.  Avoid all smoking, herbs, alcohol, and medicines not prescribed by  your health care provider. Chemicals in these affect the formation and growth of the baby.  Do not use any tobacco products, including cigarettes, chewing tobacco, and electronic cigarettes. If you need help quitting, ask your health care provider. You may receive counseling support and other resources to help you quit.  Schedule a dentist appointment. At home, brush your teeth with a soft toothbrush and be gentle when you floss. SEEK MEDICAL CARE IF:   You have dizziness.  You have mild pelvic cramps, pelvic pressure, or nagging pain in the abdominal area.  You have persistent nausea, vomiting, or diarrhea.  You have a bad smelling vaginal discharge.  You have pain with urination.  You notice increased swelling in your face, hands, legs, or ankles. SEEK IMMEDIATE MEDICAL CARE IF:   You have a fever.  You are leaking fluid from your vagina.  You have spotting or bleeding from your vagina.  You have severe abdominal cramping or pain.  You have rapid weight gain or loss.  You vomit blood or material that looks like coffee grounds.  You are exposed to German measles and have never had them.  You are exposed to fifth disease or chickenpox.  You develop a severe headache.  You have shortness of breath.  You have any kind of trauma, such as from a fall or a car accident.   This information is not intended to replace advice given to you by your health care provider. Make sure you discuss any questions you have with your health care provider.   Document Released: 05/05/2001 Document Revised: 06/01/2014 Document Reviewed: 03/21/2013 Elsevier Interactive Patient Education 2016 [employer]  

## 2015-12-12 NOTE — Progress Notes (Signed)
  Subjective:    Ashley Curtis is being seen today for her first obstetrical visit.  This is a planned pregnancy. She is at 5143w3d gestation by 7 wk US. Her obstetrical history is significant for h/o gestational diabetes and previous cesarean delivery. Relationship with FOB: spouse, living together. Patient does intend to breast feed. Pregnancy history fully reviewed.  Patient reports fatigue, nausea, no bleeding, no contractions, no leaking, vomiting and cramping.  H/o anemia - not currently taking iron supplements [facility]   Review of Systems:   Review of Systems  Review of Systems - General ROS: negative for - chills, fever or weight loss Psychological ROS: positive for - anxiety negative for - depression or physical abuse Hematological and Lymphatic ROS: negative Respiratory ROS: no cough, shortness of breath, or wheezing Cardiovascular ROS: no chest pain or dyspnea on exertion Gastrointestinal ROS: positive for - appetite loss and nausea/vomiting negative for - abdominal pain, blood in stools, change in bowel habits or melena Genito-Urinary ROS: no dysuria, trouble voiding, or hematuria Musculoskeletal ROS: negative for - swelling in ankle - bilateral, foot - bilateral and leg - bilateral Neurological ROS: negative for - dizziness or headaches Dermatological ROS: negative Obstetrical: Denies leaking of fluid, VB. Does not feel fluttering or movement yet.  Objective:     BP 113/81 mmHg  Pulse 84  Wt 152 lb (68.947 kg)  LMP 09/13/2015 Physical Exam  Exam Physical Examination: General appearance - alert, well appearing, and in no distress Mental status - alert, oriented to person, place, and time, normal mood, behavior, speech, dress, motor activity, and thought processes Eyes - pupils equal and reactive, extraocular eye movements intact, sclera anicteric Nose - normal and patent, no erythema, discharge or polyps Mouth - mucous membranes moist,  pharynx normal without lesions Neck - supple, no significant adenopathy Lymphatics - no palpable lymphadenopathy, no hepatosplenomegaly Heart - normal rate, regular rhythm, normal S1, S2, no murmurs, rubs, clicks or gallops Abdomen - soft, nontender, nondistended, no masses or organomegaly Neurological - alert, oriented, normal speech, no focal findings or movement disorder noted Musculoskeletal - no joint tenderness, deformity or swelling, full range of motion without pain Extremities - peripheral pulses normal, no pedal edema, no clubbing or cyanosis Skin - normal coloration and turgor, no rashes, no suspicious skin lesions noted Obstetrical - Fundus at/near suprapubic bone. Heart tones heard : 150s bpm    Assessment:    Pregnancy: G2P1001 Patient Active Problem List   Diagnosis Date Noted  . Normal pregnancy in multigravida in first trimester 12/12/2015  . Previous cesarean delivery, delivered 12/12/2015  . Abnormal antibody titer 06/23/2012  . Anxiety   . Migraine        Plan:    30 y/o G2P1001 at 11.3 weeks by 7 wk US, uncomplicated pregnancy.  Initial labs drawn. 1-hr GTT Prenatal vitamins. Problem list reviewed and updated. AFP3 discussed: requested and ordered Role of ultrasound in pregnancy discussed; fetal survey: to be performed at or after 18 weeks. Amniocentesis discussed: not discussed at this time. Follow up in 4 weeks. 50% of 30 min visit spent on counseling and coordination of care.  Questions were answered to the patient's satisfaction.   Lanora [facility]izabeth [facility] 12/12/2015

## 2015-12-13 LAB — PRENATAL PROFILE (SOLSTAS)
Antibody Screen: NEGATIVE
BASOS ABS: 0 {cells}/uL (ref 0–200)
Basophils Relative: 0 %
EOS PCT: 1 %
Eosinophils Absolute: 71 cells/uL (ref 15–500)
HEMATOCRIT: 34.3 % — AB (ref 35.0–45.0)
HEP B S AG: NEGATIVE
HIV: NONREACTIVE
Hemoglobin: 11.8 g/dL (ref 11.7–15.5)
LYMPHS PCT: 20 %
Lymphs Abs: 1420 cells/uL (ref 850–3900)
MCH: 30.1 pg (ref 27.0–33.0)
MCHC: 34.4 g/dL (ref 32.0–36.0)
MCV: 87.5 fL (ref 80.0–100.0)
MONO ABS: 355 {cells}/uL (ref 200–950)
MPV: 9.6 fL (ref 7.5–12.5)
Monocytes Relative: 5 %
NEUTROS PCT: 74 %
Neutro Abs: 5254 cells/uL (ref 1500–7800)
Platelets: 292 10*3/uL (ref 140–400)
RBC: 3.92 MIL/uL (ref 3.80–5.10)
RDW: 13.2 % (ref 11.0–15.0)
RH TYPE: POSITIVE
RUBELLA: 6.14 {index} — AB (ref <0.90)
WBC: 7.1 10*3/uL (ref 3.8–10.8)

## 2015-12-13 LAB — GLUCOSE TOLERANCE, 1 HOUR (50G) W/O FASTING: Glucose, 1 Hr, gestational: 143 mg/dL — ABNORMAL HIGH (ref <140)

## 2015-12-16 ENCOUNTER — Telehealth: Payer: Self-pay

## 2015-12-16 DIAGNOSIS — O09299 Supervision of pregnancy with other poor reproductive or obstetric history, unspecified trimester: Secondary | ICD-10-CM | POA: Insufficient documentation

## 2015-12-16 DIAGNOSIS — Z8632 Personal history of gestational diabetes: Secondary | ICD-10-CM

## 2015-12-16 NOTE — Telephone Encounter (Signed)
Patient needs a 3 hour gtt she did not pass her 1hour

## 2015-12-17 ENCOUNTER — Other Ambulatory Visit: Payer: Self-pay

## 2015-12-17 ENCOUNTER — Encounter: Payer: Self-pay | Admitting: Obstetrics & Gynecology

## 2015-12-18 ENCOUNTER — Other Ambulatory Visit: Payer: Self-pay

## 2015-12-19 ENCOUNTER — Other Ambulatory Visit: Payer: Self-pay

## 2015-12-19 ENCOUNTER — Telehealth: Payer: Self-pay | Admitting: General Practice

## 2015-12-19 LAB — CYSTIC FIBROSIS DIAGNOSTIC STUDY

## 2015-12-19 NOTE — Telephone Encounter (Signed)
Patient came into office today for 3 hr gtt.

## 2015-12-24 ENCOUNTER — Other Ambulatory Visit: Payer: Self-pay

## 2015-12-25 ENCOUNTER — Other Ambulatory Visit: Payer: Self-pay

## 2015-12-26 ENCOUNTER — Encounter (HOSPITAL_COMMUNITY): Payer: Self-pay

## 2015-12-26 ENCOUNTER — Ambulatory Visit (HOSPITAL_COMMUNITY)
Admission: RE | Admit: 2015-12-26 | Discharge: 2015-12-26 | Disposition: A | Discharge status: Home / Self Care | Payer: Self-pay | Source: Ambulatory Visit | Attending: Student | Admitting: Student

## 2015-12-26 DIAGNOSIS — O34219 Maternal care for unspecified type scar from previous cesarean delivery: Secondary | ICD-10-CM | POA: Insufficient documentation

## 2015-12-26 DIAGNOSIS — Z36 Encounter for antenatal screening of mother: Secondary | ICD-10-CM | POA: Insufficient documentation

## 2015-12-26 DIAGNOSIS — O09291 Supervision of pregnancy with other poor reproductive or obstetric history, first trimester: Secondary | ICD-10-CM | POA: Insufficient documentation

## 2015-12-26 DIAGNOSIS — Z3A13 13 weeks gestation of pregnancy: Secondary | ICD-10-CM | POA: Insufficient documentation

## 2015-12-26 DIAGNOSIS — Z3491 Encounter for supervision of normal pregnancy, unspecified, first trimester: Secondary | ICD-10-CM

## 2015-12-26 DIAGNOSIS — Z3481 Encounter for supervision of other normal pregnancy, first trimester: Secondary | ICD-10-CM

## 2015-12-27 ENCOUNTER — Other Ambulatory Visit: Payer: Self-pay

## 2015-12-31 ENCOUNTER — Telehealth: Payer: Self-pay | Admitting: *Deleted

## 2015-12-31 NOTE — Telephone Encounter (Signed)
Returned patient call again I am assuming this was about her 3 hour gtt. Patient has appointment schedule for 01/02/2016 for 3 hour gtt.

## 2015-12-31 NOTE — Telephone Encounter (Signed)
Pt called and stated that she received two calls from us yesterday and wants to know what it was about.

## 2016-01-02 ENCOUNTER — Other Ambulatory Visit: Payer: Self-pay

## 2016-01-03 ENCOUNTER — Inpatient Hospital Stay (HOSPITAL_COMMUNITY)
Admission: AD | Admit: 2016-01-03 | Discharge: 2016-01-03 | Disposition: A | Discharge status: Home / Self Care | Payer: Self-pay | Source: Ambulatory Visit | Attending: Family Medicine | Admitting: Family Medicine

## 2016-01-03 ENCOUNTER — Telehealth: Payer: Self-pay | Admitting: *Deleted

## 2016-01-03 ENCOUNTER — Encounter (HOSPITAL_COMMUNITY): Payer: Self-pay | Admitting: *Deleted

## 2016-01-03 DIAGNOSIS — O99342 Other mental disorders complicating pregnancy, second trimester: Secondary | ICD-10-CM | POA: Insufficient documentation

## 2016-01-03 DIAGNOSIS — Z3481 Encounter for supervision of other normal pregnancy, first trimester: Secondary | ICD-10-CM

## 2016-01-03 DIAGNOSIS — O34219 Maternal care for unspecified type scar from previous cesarean delivery: Secondary | ICD-10-CM

## 2016-01-03 DIAGNOSIS — Z3A14 14 weeks gestation of pregnancy: Secondary | ICD-10-CM | POA: Insufficient documentation

## 2016-01-03 DIAGNOSIS — Z3492 Encounter for supervision of normal pregnancy, unspecified, second trimester: Secondary | ICD-10-CM | POA: Insufficient documentation

## 2016-01-03 DIAGNOSIS — G43909 Migraine, unspecified, not intractable, without status migrainosus: Secondary | ICD-10-CM | POA: Insufficient documentation

## 2016-01-03 DIAGNOSIS — G44219 Episodic tension-type headache, not intractable: Secondary | ICD-10-CM

## 2016-01-03 DIAGNOSIS — O219 Vomiting of pregnancy, unspecified: Secondary | ICD-10-CM

## 2016-01-03 DIAGNOSIS — Z87891 Personal history of nicotine dependence: Secondary | ICD-10-CM | POA: Insufficient documentation

## 2016-01-03 DIAGNOSIS — F419 Anxiety disorder, unspecified: Secondary | ICD-10-CM | POA: Insufficient documentation

## 2016-01-03 HISTORY — DX: Gestational diabetes mellitus in pregnancy, unspecified control: O24.419

## 2016-01-03 LAB — URINALYSIS, ROUTINE W REFLEX MICROSCOPIC
BILIRUBIN URINE: NEGATIVE
GLUCOSE, UA: NEGATIVE mg/dL
HGB URINE DIPSTICK: NEGATIVE
KETONES UR: NEGATIVE mg/dL
Leukocytes, UA: NEGATIVE
NITRITE: NEGATIVE
PH: 6 (ref 5.0–8.0)
Protein, ur: NEGATIVE mg/dL

## 2016-01-03 MED ORDER — METOCLOPRAMIDE HCL 5 MG/ML IJ SOLN
10.0000 mg | Freq: Once | INTRAMUSCULAR | Status: AC
  Administered 2016-01-03: 10 mg via INTRAVENOUS
  Filled 2016-01-03: qty 2

## 2016-01-03 MED ORDER — SODIUM CHLORIDE 0.9 % IV SOLN
Freq: Once | INTRAVENOUS | Status: AC
  Administered 2016-01-03: 17:00:00 via INTRAVENOUS

## 2016-01-03 NOTE — Telephone Encounter (Signed)
Pt left message yesterday @ 1100 stating that this is about the 5th time she has had to reschedule her 3hr GTT because she keeps getting sick all the time.  She also is having a headache. Please call back.

## 2016-01-03 NOTE — MAU Provider Note (Signed)
Chief Complaint: Headache and Morning Sickness   First Provider Initiated Contact with Patient 01/03/16 1610        SUBJECTIVE Ashley MerlBarbara Curtis is a 30 y.o. G2P1001 at 6611w4d by LMP who presents to maternity admissions reporting headache and nausea.  States has been using Unisom/B6 at night but not during day because of sleepiness.  Not drinking much. Has not tried anything for headache. . She denies vaginal bleeding, vaginal itching/burning, urinary symptoms, dizziness, or fever/chills.    Headache   This is a recurrent problem. The current episode started in the past 7 days. The problem occurs constantly. The problem has been unchanged. The pain is located in the bilateral region. The pain does not radiate. The pain quality is similar to prior headaches. The quality of the pain is described as aching and dull. The pain is moderate. Associated symptoms include nausea and vomiting. Pertinent negatives include no abdominal pain, back pain, blurred vision, dizziness, fever, loss of balance, muscle aches, neck pain, sinus pressure or visual change. Nothing aggravates the symptoms. She has tried nothing for the symptoms.     RN Note: Pt reports she has been having a headache x 3 days. Has had N/V through out her pregnancy. Nausea medication not helping much. Pt not taking anything for headache.   Past Medical History:  Diagnosis Date  . Anemia   . Anxiety   . Gestational diabetes   . Migraine    Past Surgical History:  Procedure Laterality Date  . APPENDECTOMY     6 yoa  . CESAREAN SECTION N/A 12/04/2012   Procedure: Primary CESAREAN SECTION of baby girl at 2143 APGAR 9/9;  Surgeon: Esmeralda ArthurSandra A Rivard, MD;  Location: [facility];  Service: Obstetrics;  Laterality: N/A;   Social History   Social History  . Marital status: Married    Spouse name: Ottie GlazierRyan Milkovich  . Number of children: 0  . Years of education: 12.5   Occupational History  . Receptionist    Social History Main Topics  . Smoking status:  Former Smoker    Packs/day: 1.00    Types: Cigarettes    Quit date: 10/28/2015  . Smokeless tobacco: Never Used  . Alcohol use No  . Drug use: No  . Sexual activity: Yes    Partners: Male     Comment: Pills in the past   Other Topics Concern  . Not on file   Social History Narrative  . No narrative on file   No current facility-administered medications on file prior to encounter.    Current Outpatient Prescriptions on File Prior to Encounter  Medication Sig Dispense Refill  . Doxylamine Succinate, Sleep, (UNISOM PO) Take 0.5-1 tablets by mouth 2 (two) times daily.     . Pyridoxine HCl (VITAMIN B-6 PO) Take 1 tablet by mouth 2 (two) times daily.     . metoCLOPramide (REGLAN) 10 MG tablet Take 1 tablet (10 mg total) by mouth 3 (three) times daily before meals. (Patient not taking: Reported on 01/03/2016) 90 tablet 1  . Prenatal Vit-Fe Fumarate-FA (PRENATAL COMPLETE) 14-0.4 MG TABS Take 1 tablet by mouth daily. (Patient not taking: Reported on 01/03/2016) 60 each 0  . promethazine (PHENERGAN) 25 MG tablet Take 1 tablet (25 mg total) by mouth at bedtime as needed for nausea or vomiting. (Patient not taking: Reported on 01/03/2016) 30 tablet 1   No Known Allergies  I have reviewed patient's Past Medical Hx, Surgical Hx, Family Hx, Social Hx, medications and allergies.  ROS:  Review of Systems  Constitutional: Negative for chills, fatigue and fever.  HENT: Negative for sinus pressure.   Eyes: Negative for blurred vision and visual disturbance.  Respiratory: Negative for shortness of breath.   Gastrointestinal: Positive for nausea and vomiting. Negative for abdominal pain.  Genitourinary: Negative for vaginal bleeding.  Musculoskeletal: Negative for back pain and neck pain.  Neurological: Positive for headaches. Negative for dizziness, facial asymmetry, speech difficulty, light-headedness and loss of balance.   Other systems negative  Physical Exam  Patient Vitals for the past 24  hrs:  BP Temp Pulse Resp  01/03/16 1551 120/77 98.1 F (36.7 C) 92 18   Physical Exam  Constitutional: Well-developed, well-nourished female in no acute distress.  Cardiovascular: normal rate and rhythm Respiratory: normal effort GI: Abd soft, non-tender. Pos BS x 4 MS: Extremities nontender, no edema, normal ROM Neurologic: Alert and oriented x 4. No facial droop.  Neuro grossly intact GU: Neg CVAT.  FHT 156 by doppler   LAB RESULTS Results for orders placed or performed during the hospital encounter of 01/03/16 (from the past 24 hour(s))  Urinalysis, Routine w reflex microscopic (not at [facility])     Status: Abnormal   Collection Time: 01/03/16  3:51 PM  Result Value Ref Range   Color, Urine YELLOW YELLOW   APPearance CLEAR CLEAR   Specific Gravity, Urine >1.030 (H) 1.005 - 1.030   pH 6.0 5.0 - 8.0   Glucose, UA NEGATIVE NEGATIVE mg/dL   Hgb urine dipstick NEGATIVE NEGATIVE   Bilirubin Urine NEGATIVE NEGATIVE   Ketones, ur NEGATIVE NEGATIVE mg/dL   Protein, ur NEGATIVE NEGATIVE mg/dL   Nitrite NEGATIVE NEGATIVE   Leukocytes, UA NEGATIVE NEGATIVE    B/POS/-- (07/20 1123)  IMAGING [place] Mfm Fetal Nuchal Translucency  Result Date: 12/26/2015 OBSTETRICAL ULTRASOUND: This exam was performed within a [facility] [place] Department. The OB US report was generated in the [facility] system, and faxed to the ordering physician.  This report is available in the [employer]. See the [facility] Obstetric US report via the Image Link.   MAU Management/MDM: Discussed options  Will try Reglan for nausea and for headache.  Had excellent relief of headache and nausea. Will add this to her regimen for home for nausea during day time and use Unisom/B6 at night.  May use every 6 hrs prn for H/A Cautioned about dystonic reaction.   ASSESSMENT 1. Normal pregnancy in multigravida in first trimester   2. Previous cesarean delivery, delivered     PLAN Discharge home Rx Reglan for PRN use at home for  nausea and headache Continue to push fluids as tolerated Advance diet as tolerated  Pt stable at time of discharge. Encouraged to return here or to other Urgent Care/ED if she develops worsening of symptoms, increase in pain, fever, or other concerning symptoms.    Wynelle Bourgeois CNM, MSN Certified Nurse-Midwife 01/03/2016  4:30 PM

## 2016-01-03 NOTE — Discharge Instructions (Signed)
General Headache Without Cause A headache is pain or discomfort felt around the head or neck area. The specific cause of a headache may not be found. There are many causes and types of headaches. A few common ones are:  Tension headaches.  Migraine headaches.  Cluster headaches.  Chronic daily headaches. HOME CARE INSTRUCTIONS  Watch your condition for any changes. Take these steps to help with your condition: Managing Pain  Take over-the-counter and prescription medicines only as told by your health care provider.  Lie down in a dark, quiet room when you have a headache.  If directed, apply ice to the head and neck area:  Put ice in a plastic bag.  Place a towel between your skin and the bag.  Leave the ice on for 20 minutes, 2-3 times per day.  Use a heating pad or hot shower to apply heat to the head and neck area as told by your health care provider.  Keep lights dim if bright lights bother you or make your headaches worse. Eating and Drinking  Eat meals on a regular schedule.  Limit alcohol use.  Decrease the amount of caffeine you drink, or stop drinking caffeine. General Instructions  Keep all follow-up visits as told by your health care provider. This is important.  Keep a headache journal to help find out what may trigger your headaches. For example, write down:  What you eat and drink.  How much sleep you get.  Any change to your diet or medicines.  Try massage or other relaxation techniques.  Limit stress.  Sit up straight, and do not tense your muscles.  Do not use tobacco products, including cigarettes, chewing tobacco, or e-cigarettes. If you need help quitting, ask your health care provider.  Exercise regularly as told by your health care provider.  Sleep on a regular schedule. Get 7-9 hours of sleep, or the amount recommended by your health care provider. SEEK MEDICAL CARE IF:   Your symptoms are not helped by medicine.  You have a  headache that is different from the usual headache.  You have nausea or you vomit.  You have a fever. SEEK IMMEDIATE MEDICAL CARE IF:   Your headache becomes severe.  You have repeated vomiting.  You have a stiff neck.  You have a loss of vision.  You have problems with speech.  You have pain in the eye or ear.  You have muscular weakness or loss of muscle control.  You lose your balance or have trouble walking.  You feel faint or pass out.  You have confusion.   This information is not intended to replace advice given to you by your health care provider. Make sure you discuss any questions you have with your health care provider.   Document Released: 05/11/2005 Document Revised: 01/30/2015 Document Reviewed: 09/03/2014 Elsevier Interactive Patient Education 2016 Elsevier Inc[employer] Morning Sickness Morning sickness is when you feel sick to your stomach (nauseous) during pregnancy. This nauseous feeling may or may not come with vomiting. It often occurs in the morning but can be a problem any time of day. Morning sickness is most common during the first trimester, but it may continue throughout pregnancy. While morning sickness is unpleasant, it is usually harmless unless you develop severe and continual vomiting (hyperemesis gravidarum). This condition requires more intense treatment.  CAUSES  The cause of morning sickness is not completely known but seems to be related to normal hormonal changes that occur in pregnancy. RISK FACTORS You are  at greater risk if you:  Experienced nausea or vomiting before your pregnancy.  Had morning sickness during a previous pregnancy.  Are pregnant with more than one baby, such as twins. TREATMENT  Do not use any medicines (prescription, over-the-counter, or herbal) for morning sickness without first talking to your health care provider. Your health care provider may prescribe or recommend:  Vitamin B6 supplements.  Anti-nausea  medicines.  The herbal medicine ginger. HOME CARE INSTRUCTIONS   Only take over-the-counter or prescription medicines as directed by your health care provider.  Taking multivitamins before getting pregnant can prevent or decrease the severity of morning sickness in most women.  Eat a piece of dry toast or unsalted crackers before getting out of bed in the morning.  Eat five or six small meals a day.  Eat dry and bland foods (rice, baked potato). Foods high in carbohydrates are often helpful.  Do not drink liquids with your meals. Drink liquids between meals.  Avoid greasy, fatty, and spicy foods.  Get someone to cook for you if the smell of any food causes nausea and vomiting.  If you feel nauseous after taking prenatal vitamins, take the vitamins at night or with a snack.  Snack on protein foods (nuts, yogurt, cheese) between meals if you are hungry.  Eat unsweetened gelatins for desserts.  Wearing an acupressure wristband (worn for sea sickness) may be helpful.  Acupuncture may be helpful.  Do not smoke.  Get a humidifier to keep the air in your house free of odors.  Get plenty of fresh air. SEEK MEDICAL CARE IF:   Your home remedies are not working, and you need medicine.  You feel dizzy or lightheaded.  You are losing weight. SEEK IMMEDIATE MEDICAL CARE IF:   You have persistent and uncontrolled nausea and vomiting.  You pass out (faint). MAKE SURE YOU:  Understand these instructions.  Will watch your condition.  Will get help right away if you are not doing well or get worse.   This information is not intended to replace advice given to you by your health care provider. Make sure you discuss any questions you have with your health care provider.   Document Released: 07/02/2006 Document Revised: 05/16/2013 Document Reviewed: 10/26/2012 Elsevier Interactive Patient Education [employer]

## 2016-01-03 NOTE — MAU Note (Signed)
Urine in lab 

## 2016-01-03 NOTE — MAU Note (Signed)
Pt reports she has been having a headache x 3 days. Has had N/V through out her pregnancy. Nausea medication not helping much. Pt not taking anything for headache.

## 2016-01-07 ENCOUNTER — Other Ambulatory Visit: Payer: Self-pay

## 2016-01-13 ENCOUNTER — Other Ambulatory Visit (HOSPITAL_COMMUNITY): Payer: Self-pay

## 2016-01-16 ENCOUNTER — Ambulatory Visit (INDEPENDENT_AMBULATORY_CARE_PROVIDER_SITE_OTHER): Payer: Self-pay | Admitting: Advanced Practice Midwife

## 2016-01-16 VITALS — BP 122/80 | HR 83 | Wt 161.1 lb

## 2016-01-16 DIAGNOSIS — Z3482 Encounter for supervision of other normal pregnancy, second trimester: Secondary | ICD-10-CM

## 2016-01-16 DIAGNOSIS — Z3481 Encounter for supervision of other normal pregnancy, first trimester: Secondary | ICD-10-CM

## 2016-01-16 DIAGNOSIS — Z1389 Encounter for screening for other disorder: Secondary | ICD-10-CM

## 2016-01-16 DIAGNOSIS — Z3492 Encounter for supervision of normal pregnancy, unspecified, second trimester: Secondary | ICD-10-CM

## 2016-01-16 LAB — POCT URINALYSIS DIP (DEVICE)
BILIRUBIN URINE: NEGATIVE
Glucose, UA: NEGATIVE mg/dL
HGB URINE DIPSTICK: NEGATIVE
Ketones, ur: NEGATIVE mg/dL
LEUKOCYTES UA: NEGATIVE
NITRITE: POSITIVE — AB
PH: 7 (ref 5.0–8.0)
Protein, ur: NEGATIVE mg/dL
SPECIFIC GRAVITY, URINE: 1.02 (ref 1.005–1.030)
UROBILINOGEN UA: 0.2 mg/dL (ref 0.0–1.0)

## 2016-01-16 MED ORDER — ONDANSETRON HCL 4 MG PO TABS: 4.0000 mg | ORAL_TABLET | Freq: Three times a day (TID) | ORAL | 1 refills | Status: DC | PRN

## 2016-01-16 NOTE — Patient Instructions (Signed)
Bring Brach's or Starburst jelly beans to you lab visit. Do not eat them until AFTER your first blood draw.  Eat either 56 Startburst or 36 Brach's jelly beans.   Second Trimester of Pregnancy The second trimester is from week 13 through week 28, months 4 through 6. The second trimester is often a time when you feel your best. Your body has also adjusted to being pregnant, and you begin to feel better physically. Usually, morning sickness has lessened or quit completely, you may have more energy, and you may have an increase in appetite. The second trimester is also a time when the fetus is growing rapidly. At the end of the sixth month, the fetus is about 9 inches long and weighs about 1 pounds. You will likely begin to feel the baby move (quickening) between 18 and 20 weeks of the pregnancy. BODY CHANGES Your body goes through many changes during pregnancy. The changes vary from woman to woman.   Your weight will continue to increase. You will notice your lower abdomen bulging out.  You may begin to get stretch marks on your hips, abdomen, and breasts.  You may develop headaches that can be relieved by medicines approved by your health care provider.  You may urinate more often because the fetus is pressing on your bladder.  You may develop or continue to have heartburn as a result of your pregnancy.  You may develop constipation because certain hormones are causing the muscles that push waste through your intestines to slow down.  You may develop hemorrhoids or swollen, bulging veins (varicose veins).  You may have back pain because of the weight gain and pregnancy hormones relaxing your joints between the bones in your pelvis and as a result of a shift in weight and the muscles that support your balance.  Your breasts will continue to grow and be tender.  Your gums may bleed and may be sensitive to brushing and flossing.  Dark spots or blotches (chloasma, mask of pregnancy) may  develop on your face. This will likely fade after the baby is born.  A dark line from your belly button to the pubic area (linea nigra) may appear. This will likely fade after the baby is born.  You may have changes in your hair. These can include thickening of your hair, rapid growth, and changes in texture. Some women also have hair loss during or after pregnancy, or hair that feels dry or thin. Your hair will most likely return to normal after your baby is born. WHAT TO EXPECT AT YOUR PRENATAL VISITS During a routine prenatal visit:  You will be weighed to make sure you and the fetus are growing normally.  Your blood pressure will be taken.  Your abdomen will be measured to track your baby's growth.  The fetal heartbeat will be listened to.  Any test results from the previous visit will be discussed. Your health care provider may ask you:  How you are feeling.  If you are feeling the baby move.  If you have had any abnormal symptoms, such as leaking fluid, bleeding, severe headaches, or abdominal cramping.  If you are using any tobacco products, including cigarettes, chewing tobacco, and electronic cigarettes.  If you have any questions. Other tests that may be performed during your second trimester include:  Blood tests that check for:  Low iron levels (anemia).  Gestational diabetes (between 24 and 28 weeks).  Rh antibodies.  Urine tests to check for infections, diabetes, or  protein in the urine.  An ultrasound to confirm the proper growth and development of the baby.  An amniocentesis to check for possible genetic problems.  Fetal screens for spina bifida and Down syndrome.  HIV (human immunodeficiency virus) testing. Routine prenatal testing includes screening for HIV, unless you choose not to have this test. HOME CARE INSTRUCTIONS   Avoid all smoking, herbs, alcohol, and unprescribed drugs. These chemicals affect the formation and growth of the baby.  Do not  use any tobacco products, including cigarettes, chewing tobacco, and electronic cigarettes. If you need help quitting, ask your health care provider. You may receive counseling support and other resources to help you quit.  Follow your health care provider's instructions regarding medicine use. There are medicines that are either safe or unsafe to take during pregnancy.  Exercise only as directed by your health care provider. Experiencing uterine cramps is a good sign to stop exercising.  Continue to eat regular, healthy meals.  Wear a good support bra for breast tenderness.  Do not use hot tubs, steam rooms, or saunas.  Wear your seat belt at all times when driving.  Avoid raw meat, uncooked cheese, cat litter boxes, and soil used by cats. These carry germs that can cause birth defects in the baby.  Take your prenatal vitamins.  Take 1500-2000 mg of calcium daily starting at the 20th week of pregnancy until you deliver your baby.  Try taking a stool softener (if your health care provider approves) if you develop constipation. Eat more high-fiber foods, such as fresh vegetables or fruit and whole grains. Drink plenty of fluids to keep your urine clear or pale yellow.  Take warm sitz baths to soothe any pain or discomfort caused by hemorrhoids. Use hemorrhoid cream if your health care provider approves.  If you develop varicose veins, wear support hose. Elevate your feet for 15 minutes, 3-4 times a day. Limit salt in your diet.  Avoid heavy lifting, wear low heel shoes, and practice good posture.  Rest with your legs elevated if you have leg cramps or low back pain.  Visit your dentist if you have not gone yet during your pregnancy. Use a soft toothbrush to brush your teeth and be gentle when you floss.  A sexual relationship may be continued unless your health care provider directs you otherwise.  Continue to go to all your prenatal visits as directed by your health care  provider. SEEK MEDICAL CARE IF:   You have dizziness.  You have mild pelvic cramps, pelvic pressure, or nagging pain in the abdominal area.  You have persistent nausea, vomiting, or diarrhea.  You have a bad smelling vaginal discharge.  You have pain with urination. SEEK IMMEDIATE MEDICAL CARE IF:   You have a fever.  You are leaking fluid from your vagina.  You have spotting or bleeding from your vagina.  You have severe abdominal cramping or pain.  You have rapid weight gain or loss.  You have shortness of breath with chest pain.  You notice sudden or extreme swelling of your face, hands, ankles, feet, or legs.  You have not felt your baby move in over an hour.  You have severe headaches that do not go away with medicine.  You have vision changes.   This information is not intended to replace advice given to you by your health care provider. Make sure you discuss any questions you have with your health care provider.   Document Released: 05/05/2001 Document Revised: 06/01/2014  Document Reviewed: 07/12/2012 Elsevier Interactive Patient Education [employer]

## 2016-01-16 NOTE — Progress Notes (Signed)
22

## 2016-01-16 NOTE — Progress Notes (Signed)
Subjective:  Ashley Curtis is a 30 y.o. G2P1001 at 420w3d being seen today for ongoing prenatal care.  She is currently monitored for the following issues for this low-risk pregnancy and has Anxiety; Migraine; Normal pregnancy in multigravida in first trimester; Previous cesarean delivery, delivered; and Previous gestational diabetes mellitus, antepartum on her problem list.  Patient reports nausea.   .  .  Movement: Present. Denies leaking of fluid.   The following portions of the patient's history were reviewed and updated as appropriate: allergies, current medications, past family history, past medical history, past social history, past surgical history and problem list. Problem list updated.  Objective:   Vitals:   01/16/16 1315  BP: 122/80  Pulse: 83  Weight: 161 lb 1.6 oz (73.1 kg)    Fetal Status: Fetal Heart Rate (bpm): 145 Fundal Height: 17 cm Movement: Present     General:  Alert, oriented and cooperative. Patient is in no acute distress.  Skin: Skin is warm and dry. No rash noted.   Cardiovascular: Normal heart rate noted  Respiratory: Normal respiratory effort, no problems with respiration noted  Abdomen: Soft, gravid, appropriate for gestational age. Pain/Pressure: Present     Pelvic:  Cervical exam deferred        Extremities: Normal range of motion.  Edema: None  Mental Status: Normal mood and affect. Normal behavior. Normal judgment and thought content.   Urinalysis: Urine Protein: Negative Urine Glucose: Negative  Assessment and Plan:  Pregnancy: G2P1001 at 10120w3d  1. Supervision of normal pregnancy in second trimester  - Culture, OB Urine - Pain Mgmt, Profile 6 Conf w/o mM, U - US MFM OB DETAIL +14 WK; Future  2. Normal pregnancy in multigravida in first trimester - Declined AFP  3. Encounter for routine screening for malformation using ultrasonics  - US MFM OB DETAIL +14 WK; Future  Preterm labor symptoms and general obstetric precautions including but not  limited to vaginal bleeding, contractions, leaking of fluid and fetal movement were reviewed in detail with the patient. Please refer to After Visit Summary for other counseling recommendations.  Return in about 4 weeks (around 02/13/2016) for ROB. Also needs 3 hour GTT lab visit ASAP. Will use jelly beans.Dorathy Kinsman.   Demri Poulton, CNM

## 2016-01-17 LAB — CULTURE, OB URINE: Organism ID, Bacteria: NO GROWTH

## 2016-01-19 LAB — PAIN MGMT, PROFILE 6 CONF W/O MM, U
6 Acetylmorphine: NEGATIVE ng/mL (ref <10)
ALCOHOL METABOLITES: NEGATIVE ng/mL (ref <500)
Amphetamines: NEGATIVE ng/mL (ref <500)
BENZODIAZEPINES: NEGATIVE ng/mL (ref <100)
Barbiturates: NEGATIVE ng/mL (ref <300)
Cocaine Metabolite: NEGATIVE ng/mL (ref <150)
Creatinine: 163.1 mg/dL (ref >=20.0)
MARIJUANA METABOLITE: NEGATIVE ng/mL (ref <20)
METHADONE METABOLITE: NEGATIVE ng/mL (ref <100)
OPIATES: NEGATIVE ng/mL (ref <100)
OXYCODONE: NEGATIVE ng/mL (ref <100)
Oxidant: NEGATIVE ug/mL (ref <200)
PLEASE NOTE: 0
Phencyclidine: NEGATIVE ng/mL (ref <25)
pH: 7.32 (ref 4.5–9.0)

## 2016-01-20 NOTE — Telephone Encounter (Signed)
Patient was seen in the office question have been address

## 2016-01-24 ENCOUNTER — Other Ambulatory Visit: Payer: Self-pay

## 2016-01-24 DIAGNOSIS — R7309 Other abnormal glucose: Secondary | ICD-10-CM

## 2016-01-25 LAB — GLUCOSE TOLERANCE, 3 HOURS
GLUCOSE, 2 HOUR-GESTATIONAL: 66 mg/dL (ref <165)
GLUCOSE, FASTING-GESTATIONAL: 85 mg/dL (ref 65–104)
Glucose Tolerance, 1 hour: 123 mg/dL (ref <190)
Glucose, GTT - 3 Hour: 55 mg/dL — ABNORMAL LOW (ref <145)

## 2016-01-29 ENCOUNTER — Encounter (HOSPITAL_COMMUNITY): Payer: Self-pay

## 2016-01-29 ENCOUNTER — Ambulatory Visit (HOSPITAL_COMMUNITY)
Admission: RE | Admit: 2016-01-29 | Discharge: 2016-01-29 | Disposition: A | Discharge status: Home / Self Care | Payer: Self-pay | Source: Ambulatory Visit | Attending: Advanced Practice Midwife | Admitting: Advanced Practice Midwife

## 2016-01-29 ENCOUNTER — Other Ambulatory Visit: Payer: Self-pay | Admitting: Advanced Practice Midwife

## 2016-01-29 DIAGNOSIS — Z1389 Encounter for screening for other disorder: Secondary | ICD-10-CM

## 2016-01-29 DIAGNOSIS — Z3A18 18 weeks gestation of pregnancy: Secondary | ICD-10-CM | POA: Insufficient documentation

## 2016-01-29 DIAGNOSIS — Z3492 Encounter for supervision of normal pregnancy, unspecified, second trimester: Secondary | ICD-10-CM

## 2016-01-29 DIAGNOSIS — Z36 Encounter for antenatal screening of mother: Secondary | ICD-10-CM | POA: Insufficient documentation

## 2016-01-30 ENCOUNTER — Telehealth: Payer: Self-pay | Admitting: Family Medicine

## 2016-01-30 ENCOUNTER — Other Ambulatory Visit (HOSPITAL_COMMUNITY): Payer: Self-pay | Admitting: *Deleted

## 2016-01-30 DIAGNOSIS — IMO0002 Reserved for concepts with insufficient information to code with codable children: Secondary | ICD-10-CM

## 2016-01-30 DIAGNOSIS — Z0489 Encounter for examination and observation for other specified reasons: Secondary | ICD-10-CM

## 2016-01-30 NOTE — Telephone Encounter (Signed)
Patient has stated she has called more than once and is waiting for a return call to get her results. Patient is requesting a call back ASAP.

## 2016-01-31 NOTE — Telephone Encounter (Signed)
I have spoke with patient concerning glucose results. Patient also wanted to know what she can take for her sore throat. I advised patient she can have tyenol for pain and drink plenty of fluids. Patient verbalizes understanding at this time.

## 2016-02-06 ENCOUNTER — Ambulatory Visit (HOSPITAL_COMMUNITY): Payer: Self-pay

## 2016-02-12 ENCOUNTER — Encounter: Payer: Self-pay | Admitting: Family Medicine

## 2016-02-13 ENCOUNTER — Ambulatory Visit (INDEPENDENT_AMBULATORY_CARE_PROVIDER_SITE_OTHER): Payer: Self-pay | Admitting: Certified Nurse Midwife

## 2016-02-13 VITALS — BP 113/77 | HR 85 | Wt 163.0 lb

## 2016-02-13 DIAGNOSIS — Z3482 Encounter for supervision of other normal pregnancy, second trimester: Secondary | ICD-10-CM

## 2016-02-13 DIAGNOSIS — Z3480 Encounter for supervision of other normal pregnancy, unspecified trimester: Secondary | ICD-10-CM

## 2016-02-13 DIAGNOSIS — O34219 Maternal care for unspecified type scar from previous cesarean delivery: Secondary | ICD-10-CM

## 2016-02-13 LAB — POCT URINALYSIS DIP (DEVICE)
Bilirubin Urine: NEGATIVE
Glucose, UA: 100 mg/dL — AB
Ketones, ur: NEGATIVE mg/dL
Nitrite: NEGATIVE
Protein, ur: 30 mg/dL — AB
Specific Gravity, Urine: 1.025 (ref 1.005–1.030)
Urobilinogen, UA: 2 mg/dL — ABNORMAL HIGH (ref 0.0–1.0)
pH: 6.5 (ref 5.0–8.0)

## 2016-02-13 NOTE — Progress Notes (Signed)
Subjective:  Ashley Curtis is a 30 y.o. G2P1001 at 3031w3d being seen today for ongoing prenatal care.  She is currently monitored for the following issues for this low-risk pregnancy and has Anxiety; Migraine; Normal pregnancy in multigravida, antepartum; Previous cesarean delivery, delivered; and Previous gestational diabetes mellitus, antepartum on her problem list.  Patient reports no complaints.  Contractions: Not present. Vag. Bleeding: None.  Movement: Present. Denies leaking of fluid.   The following portions of the patient's history were reviewed and updated as appropriate: allergies, current medications, past family history, past medical history, past social history, past surgical history and problem list. Problem list updated.  Objective:   Vitals:   02/13/16 1033  BP: 113/77  Pulse: 85  Weight: 163 lb (73.9 kg)    Fetal Status: Fetal Heart Rate (bpm): 142 Fundal Height: 20 cm Movement: Present     General:  Alert, oriented and cooperative. Patient is in no acute distress.  Skin: Skin is warm and dry. No rash noted.   Cardiovascular: Normal heart rate noted  Respiratory: Normal respiratory effort, no problems with respiration noted  Abdomen: Soft, gravid, appropriate for gestational age. Pain/Pressure: Present     Pelvic: Vag. Bleeding: None     Cervical exam deferred        Extremities: Normal range of motion.  Edema: None  Mental Status: Normal mood and affect. Normal behavior. Normal judgment and thought content.   Urinalysis: Urine Protein: 1+ Urine Glucose: 3+  Assessment and Plan:  Pregnancy: G2P1001 at 5931w3d  1. Normal pregnancy in multigravida in second trimester -incomplete anatomy, f/u US scheduled -glucosuria today, previously GDM, consider early screen  2. Previous cesarean delivery, delivered -planning rpt  Preterm labor symptoms and general obstetric precautions including but not limited to vaginal bleeding, contractions, leaking of fluid and fetal  movement were reviewed in detail with the patient. Please refer to After Visit Summary for other counseling recommendations.  Return in about 4 weeks (around 03/12/2016).   Donette LarryMelanie Dastan Krider, CNM

## 2016-03-11 ENCOUNTER — Ambulatory Visit (HOSPITAL_COMMUNITY)
Admission: RE | Admit: 2016-03-11 | Discharge: 2016-03-11 | Disposition: A | Discharge status: Home / Self Care | Payer: Self-pay | Source: Ambulatory Visit | Attending: Advanced Practice Midwife | Admitting: Advanced Practice Midwife

## 2016-03-11 ENCOUNTER — Encounter (HOSPITAL_COMMUNITY): Payer: Self-pay

## 2016-03-11 ENCOUNTER — Ambulatory Visit (INDEPENDENT_AMBULATORY_CARE_PROVIDER_SITE_OTHER): Payer: Self-pay | Admitting: Advanced Practice Midwife

## 2016-03-11 VITALS — BP 111/74 | HR 79 | Wt 169.0 lb

## 2016-03-11 DIAGNOSIS — IMO0002 Reserved for concepts with insufficient information to code with codable children: Secondary | ICD-10-CM

## 2016-03-11 DIAGNOSIS — O34219 Maternal care for unspecified type scar from previous cesarean delivery: Secondary | ICD-10-CM

## 2016-03-11 DIAGNOSIS — O09292 Supervision of pregnancy with other poor reproductive or obstetric history, second trimester: Secondary | ICD-10-CM | POA: Insufficient documentation

## 2016-03-11 DIAGNOSIS — Z3A24 24 weeks gestation of pregnancy: Secondary | ICD-10-CM | POA: Insufficient documentation

## 2016-03-11 DIAGNOSIS — Z0489 Encounter for examination and observation for other specified reasons: Secondary | ICD-10-CM

## 2016-03-11 DIAGNOSIS — Z3482 Encounter for supervision of other normal pregnancy, second trimester: Secondary | ICD-10-CM

## 2016-03-11 DIAGNOSIS — Z3480 Encounter for supervision of other normal pregnancy, unspecified trimester: Secondary | ICD-10-CM

## 2016-03-11 NOTE — Addendum Note (Signed)
Encounter addended by: Genevie CheshireHeather M Markita Stcharles, RT on: 03/11/2016  5:00 PM<BR>    Actions taken: Imaging Exam ended

## 2016-03-11 NOTE — Progress Notes (Signed)
   PRENATAL VISIT NOTE  Subjective:  Ashley Curtis is a 30 y.o. G2P1001 at 2920w2d being seen today for ongoing prenatal care.  She is currently monitored for the following issues for this low-risk pregnancy and has Anxiety; Migraine; Normal pregnancy in multigravida, antepartum; Previous cesarean delivery, delivered; and Previous gestational diabetes mellitus, antepartum on her problem list.  Patient reports no complaints.  Contractions: Irregular. Vag. Bleeding: None.  Movement: Present. Denies leaking of fluid.   The following portions of the patient's history were reviewed and updated as appropriate: allergies, current medications, past family history, past medical history, past social history, past surgical history and problem list. Problem list updated.  Objective:   Vitals:   03/11/16 1439  BP: 111/74  Pulse: 79  Weight: 76.7 kg (169 lb)    Fetal Status: Fetal Heart Rate (bpm): 134   Movement: Present     General:  Alert, oriented and cooperative. Patient is in no acute distress.  Skin: Skin is warm and dry. No rash noted.   Cardiovascular: Normal heart rate noted  Respiratory: Normal respiratory effort, no problems with respiration noted  Abdomen: Soft, gravid, appropriate for gestational age. Pain/Pressure: Present     Pelvic:  Cervical exam deferred        Extremities: Normal range of motion.  Edema: Trace  Mental Status: Normal mood and affect. Normal behavior. Normal judgment and thought content.   Assessment and Plan:  Pregnancy: G2P1001 at 7420w2d  1. Normal pregnancy in multigravida, antepartum     Glucola next visit  2. Previous cesarean delivery, delivered      VBAC form signed  Preterm labor symptoms and general obstetric precautions including but not limited to vaginal bleeding, contractions, leaking of fluid and fetal movement were reviewed in detail with the patient. Please refer to After Visit Summary for other counseling recommendations.  RTC 4 weeks  Aviva SignsMarie  L Hurshell Dino, CNM

## 2016-03-11 NOTE — Patient Instructions (Signed)

## 2016-03-11 NOTE — Progress Notes (Signed)
C/o lower back pain often.

## 2016-03-18 ENCOUNTER — Encounter (HOSPITAL_COMMUNITY): Payer: Self-pay | Admitting: *Deleted

## 2016-03-27 ENCOUNTER — Telehealth: Payer: Self-pay

## 2016-03-27 NOTE — Telephone Encounter (Signed)
Pt called the front office requesting a callback from a nurse.   Attempted to contact pt and unable to LM due to VM box not set up.

## 2016-04-02 ENCOUNTER — Other Ambulatory Visit: Payer: Self-pay | Admitting: Obstetrics & Gynecology

## 2016-04-08 ENCOUNTER — Ambulatory Visit (INDEPENDENT_AMBULATORY_CARE_PROVIDER_SITE_OTHER): Payer: Self-pay | Admitting: Student

## 2016-04-08 VITALS — BP 123/78 | HR 73 | Wt 174.0 lb

## 2016-04-08 DIAGNOSIS — O09293 Supervision of pregnancy with other poor reproductive or obstetric history, third trimester: Secondary | ICD-10-CM

## 2016-04-08 DIAGNOSIS — O09299 Supervision of pregnancy with other poor reproductive or obstetric history, unspecified trimester: Secondary | ICD-10-CM

## 2016-04-08 DIAGNOSIS — Z3480 Encounter for supervision of other normal pregnancy, unspecified trimester: Secondary | ICD-10-CM

## 2016-04-08 DIAGNOSIS — Z8632 Personal history of gestational diabetes: Secondary | ICD-10-CM

## 2016-04-08 DIAGNOSIS — O34219 Maternal care for unspecified type scar from previous cesarean delivery: Secondary | ICD-10-CM

## 2016-04-08 NOTE — Patient Instructions (Signed)
Third Trimester of Pregnancy The third trimester is from week 29 through week 40 (months 7 through 9). The third trimester is a time when the unborn baby (fetus) is growing rapidly. At the end of the ninth month, the fetus is about 20 inches in length and weighs 6-10 pounds. Body changes during your third trimester Your body goes through many changes during pregnancy. The changes vary from woman to woman. During the third trimester:  Your weight will continue to increase. You can expect to gain 25-35 pounds (11-16 kg) by the end of the pregnancy.  You may begin to get stretch marks on your hips, abdomen, and breasts.  You may urinate more often because the fetus is moving lower into your pelvis and pressing on your bladder.  You may develop or continue to have heartburn. This is caused by increased hormones that slow down muscles in the digestive tract.  You may develop or continue to have constipation because increased hormones slow digestion and cause the muscles that push waste through your intestines to relax.  You may develop hemorrhoids. These are swollen veins (varicose veins) in the rectum that can itch or be painful.  You may develop swollen, bulging veins (varicose veins) in your legs.  You may have increased body aches in the pelvis, back, or thighs. This is due to weight gain and increased hormones that are relaxing your joints.  You may have changes in your hair. These can include thickening of your hair, rapid growth, and changes in texture. Some women also have hair loss during or after pregnancy, or hair that feels dry or thin. Your hair will most likely return to normal after your baby is born.  Your breasts will continue to grow and they will continue to become tender. A yellow fluid (colostrum) may leak from your breasts. This is the first milk you are producing for your baby.  Your belly button may stick out.  You may notice more swelling in your hands, face, or  ankles.  You may have increased tingling or numbness in your hands, arms, and legs. The skin on your belly may also feel numb.  You may feel short of breath because of your expanding uterus.  You may have more problems sleeping. This can be caused by the size of your belly, increased need to urinate, and an increase in your body's metabolism.  You may notice the fetus "dropping," or moving lower in your abdomen.  You may have increased vaginal discharge.  Your cervix becomes thin and soft (effaced) near your due date. What to expect at prenatal visits You will have prenatal exams every 2 weeks until week 36. Then you will have weekly prenatal exams. During a routine prenatal visit:  You will be weighed to make sure you and the fetus are growing normally.  Your blood pressure will be taken.  Your abdomen will be measured to track your baby's growth.  The fetal heartbeat will be listened to.  Any test results from the previous visit will be discussed.  You may have a cervical check near your due date to see if you have effaced. At around 36 weeks, your health care provider will check your cervix. At the same time, your health care provider will also perform a test on the secretions of the vaginal tissue. This test is to determine if a type of bacteria, Group B streptococcus, is present. Your health care provider will explain this further. Your health care provider may ask you:    What your birth plan is.  How you are feeling.  If you are feeling the baby move.  If you have had any abnormal symptoms, such as leaking fluid, bleeding, severe headaches, or abdominal cramping.  If you are using any tobacco products, including cigarettes, chewing tobacco, and electronic cigarettes.  If you have any questions. Other tests or screenings that may be performed during your third trimester include:  Blood tests that check for low iron levels (anemia).  Fetal testing to check the health,  activity level, and growth of the fetus. Testing is done if you have certain medical conditions or if there are problems during the pregnancy.  Nonstress test (NST). This test checks the health of your baby to make sure there are no signs of problems, such as the baby not getting enough oxygen. During this test, a belt is placed around your belly. The baby is made to move, and its heart rate is monitored during movement. What is false labor? False labor is a condition in which you feel small, irregular tightenings of the muscles in the womb (contractions) that eventually go away. These are called Braxton Hicks contractions. Contractions may last for hours, days, or even weeks before true labor sets in. If contractions come at regular intervals, become more frequent, increase in intensity, or become painful, you should see your health care provider. What are the signs of labor?  Abdominal cramps.  Regular contractions that start at 10 minutes apart and become stronger and more frequent with time.  Contractions that start on the top of the uterus and spread down to the lower abdomen and back.  Increased pelvic pressure and dull back pain.  A watery or bloody mucus discharge that comes from the vagina.  Leaking of amniotic fluid. This is also known as your "water breaking." It could be a slow trickle or a gush. Let your doctor know if it has a color or strange odor. If you have any of these signs, call your health care provider right away, even if it is before your due date. Follow these instructions at home: Eating and drinking  Continue to eat regular, healthy meals.  Do not eat:  Raw meat or meat spreads.  Unpasteurized milk or cheese.  Unpasteurized juice.  Store-made salad.  Refrigerated smoked seafood.  Hot dogs or deli meat, unless they are piping hot.  More than 6 ounces of albacore tuna a week.  Shark, swordfish, king mackerel, or tile fish.  Store-made salads.  Raw  sprouts, such as mung bean or alfalfa sprouts.  Take prenatal vitamins as told by your health care provider.  Take 1000 mg of calcium daily as told by your health care provider.  If you develop constipation:  Take over-the-counter or prescription medicines.  Drink enough fluid to keep your urine clear or pale yellow.  Eat foods that are high in fiber, such as fresh fruits and vegetables, whole grains, and beans.  Limit foods that are high in fat and processed sugars, such as fried and sweet foods. Activity  Exercise only as directed by your health care provider. Healthy pregnant women should aim for 2 hours and 30 minutes of moderate exercise per week. If you experience any pain or discomfort while exercising, stop.  Avoid heavy lifting.  Do not exercise in extreme heat or humidity, or at high altitudes.  Wear low-heel, comfortable shoes.  Practice good posture.  Do not travel far distances unless it is absolutely necessary and only with the approval   of your health care provider.  Wear your seat belt at all times while in a car, on a bus, or on a plane.  Take frequent breaks and rest with your legs elevated if you have leg cramps or low back pain.  Do not use hot tubs, steam rooms, or saunas.  You may continue to have sex unless your health care provider tells you otherwise. Lifestyle  Do not use any products that contain nicotine or tobacco, such as cigarettes and e-cigarettes. If you need help quitting, ask your health care provider.  Do not drink alcohol.  Do not use any medicinal herbs or unprescribed drugs. These chemicals affect the formation and growth of the baby.  If you develop varicose veins:  Wear support pantyhose or compression stockings as told by your healthcare provider.  Elevate your feet for 15 minutes, 3-4 times a day.  Wear a supportive maternity bra to help with breast tenderness. General instructions  Take over-the-counter and prescription  medicines only as told by your health care provider. There are medicines that are either safe or unsafe to take during pregnancy.  Take warm sitz baths to soothe any pain or discomfort caused by hemorrhoids. Use hemorrhoid cream or witch hazel if your health care provider approves.  Avoid cat litter boxes and soil used by cats. These carry germs that can cause birth defects in the baby. If you have a cat, ask someone to clean the litter box for you.  To prepare for the arrival of your baby:  Take prenatal classes to understand, practice, and ask questions about the labor and delivery.  Make a trial run to the hospital.  Visit the hospital and tour the maternity area.  Arrange for maternity or paternity leave through employers.  Arrange for family and friends to take care of pets while you are in the hospital.  Purchase a rear-facing car seat and make sure you know how to install it in your car.  Pack your hospital bag.  Prepare the baby's nursery. Make sure to remove all pillows and stuffed animals from the baby's crib to prevent suffocation.  Visit your dentist if you have not gone during your pregnancy. Use a soft toothbrush to brush your teeth and be gentle when you floss.  Keep all prenatal follow-up visits as told by your health care provider. This is important. Contact a health care provider if:  You are unsure if you are in labor or if your water has broken.  You become dizzy.  You have mild pelvic cramps, pelvic pressure, or nagging pain in your abdominal area.  You have lower back pain.  You have persistent nausea, vomiting, or diarrhea.  You have an unusual or bad smelling vaginal discharge.  You have pain when you urinate. Get help right away if:  You have a fever.  You are leaking fluid from your vagina.  You have spotting or bleeding from your vagina.  You have severe abdominal pain or cramping.  You have rapid weight loss or weight gain.  You have  shortness of breath with chest pain.  You notice sudden or extreme swelling of your face, hands, ankles, feet, or legs.  Your baby makes fewer than 10 movements in 2 hours.  You have severe headaches that do not go away with medicine.  You have vision changes. Summary  The third trimester is from week 29 through week 40, months 7 through 9. The third trimester is a time when the unborn baby (fetus)   is growing rapidly.  During the third trimester, your discomfort may increase as you and your baby continue to gain weight. You may have abdominal, leg, and back pain, sleeping problems, and an increased need to urinate.  During the third trimester your breasts will keep growing and they will continue to become tender. A yellow fluid (colostrum) may leak from your breasts. This is the first milk you are producing for your baby.  False labor is a condition in which you feel small, irregular tightenings of the muscles in the womb (contractions) that eventually go away. These are called Braxton Hicks contractions. Contractions may last for hours, days, or even weeks before true labor sets in.  Signs of labor can include: abdominal cramps; regular contractions that start at 10 minutes apart and become stronger and more frequent with time; watery or bloody mucus discharge that comes from the vagina; increased pelvic pressure and dull back pain; and leaking of amniotic fluid. This information is not intended to replace advice given to you by your health care provider. Make sure you discuss any questions you have with your health care provider. Document Released: 05/05/2001 Document Revised: 10/17/2015 Document Reviewed: 07/12/2012 Elsevier Interactive Patient Education  2017 [employer]  

## 2016-04-08 NOTE — Progress Notes (Signed)
   PRENATAL VISIT NOTE  Subjective:  Ashley Curtis is a 30 y.o. G2P1001 at 2637w2d being seen today for ongoing prenatal care.  She is currently monitored for the following issues for this low-risk pregnancy and has Anxiety; Migraine; Normal pregnancy in multigravida, antepartum; Previous cesarean delivery, delivered; and Previous gestational diabetes mellitus, antepartum on her problem list.  Patient reports occasional vaginal itching without odor or abnormal discharge.  Contractions: Not present. Vag. Bleeding: None.  Movement: Present. Denies leaking of fluid.   The following portions of the patient's history were reviewed and updated as appropriate: allergies, current medications, past family history, past medical history, past social history, past surgical history and problem list. Problem list updated.  Objective:   Vitals:   04/08/16 1256  BP: 123/78  Pulse: 73  Weight: 174 lb (78.9 kg)    Fetal Status: Fetal Heart Rate (bpm): 140   Movement: Present     General:  Alert, oriented and cooperative. Patient is in no acute distress.  Skin: Skin is warm and dry. No rash noted.   Cardiovascular: Normal heart rate noted  Respiratory: Normal respiratory effort, no problems with respiration noted  Abdomen: Soft, gravid, appropriate for gestational age. Pain/Pressure: Present     Pelvic:  Cervical exam deferred        Extremities: Normal range of motion.  Edema: None  Mental Status: Normal mood and affect. Normal behavior. Normal judgment and thought content.   Assessment and Plan:  Pregnancy: G2P1001 at 2237w2d feeling good. She feels good fetal movement. She has some vaginal itching that seems to be related to the fact that she showers 3 times a day and is constantly washing her vulva. I recommended that she use a pantyliner for discharge and stop using harsh soaps. She did not want to do a speculum exam today; I suggested she change her hygiene practices and we can reevaluate in two weeks  at her next OB FUP.   1. Previous cesarean delivery, delivered Patient is scheduled for 06/22/16  2. Previous gestational diabetes mellitus, antepartum 2 hour GTT to be scheduled this weekl  3. Normal pregnancy in multigravida, antepartum  Preterm labor symptoms and general obstetric precautions including but not limited to vaginal bleeding, contractions, leaking of fluid and fetal movement were reviewed in detail with the patient. Please refer to After Visit Summary for other counseling recommendations.  Return in about 2 weeks (around 04/22/2016).   Marylene LandKathryn Lorraine Tanazia Achee, CNM

## 2016-04-08 NOTE — Progress Notes (Signed)
Patient declines tdap

## 2016-04-15 ENCOUNTER — Ambulatory Visit: Payer: Self-pay

## 2016-04-21 ENCOUNTER — Ambulatory Visit: Payer: Self-pay

## 2016-04-21 ENCOUNTER — Telehealth: Payer: Self-pay | Admitting: *Deleted

## 2016-04-21 DIAGNOSIS — N898 Other specified noninflammatory disorders of vagina: Secondary | ICD-10-CM

## 2016-04-21 MED ORDER — TERCONAZOLE 0.4 % VA CREA: 1.0000 | TOPICAL_CREAM | Freq: Every day | VAGINAL | 0 refills | Status: AC

## 2016-04-21 NOTE — Telephone Encounter (Signed)
Call transferred from front desk. C/o vaginal itching- wants medicine to take for that. She denies vaginal discharge except for clear discharge.   I informed her I can send in terconazole to pharmacy or she can take monitstat and if this does not relieve itching or she has discharge , can get it checked at her ob visit. Next week. She voices understanding.

## 2016-04-29 ENCOUNTER — Ambulatory Visit (INDEPENDENT_AMBULATORY_CARE_PROVIDER_SITE_OTHER): Payer: Self-pay | Admitting: Family Medicine

## 2016-04-29 DIAGNOSIS — Z3480 Encounter for supervision of other normal pregnancy, unspecified trimester: Secondary | ICD-10-CM

## 2016-04-29 DIAGNOSIS — Z3483 Encounter for supervision of other normal pregnancy, third trimester: Secondary | ICD-10-CM

## 2016-04-29 NOTE — Progress Notes (Signed)
   PRENATAL VISIT NOTE  Subjective:  Ashley Curtis is a 30 y.o. G2P1001 at 4144w2d being seen today for ongoing prenatal care.  She is currently monitored for the following issues for this low-risk pregnancy and has Anxiety; Migraine; Normal pregnancy in multigravida, antepartum; Previous cesarean section complicating pregnancy, antepartum condition or complication; and Previous gestational diabetes mellitus, antepartum on her problem list.  Patient reports no complaints.  Contractions: Not present.  .  Movement: Present. Denies leaking of fluid.   The following portions of the patient's history were reviewed and updated as appropriate: allergies, current medications, past family history, past medical history, past social history, past surgical history and problem list. Problem list updated.  Objective:   Vitals:   04/29/16 1047  BP: 123/81  Pulse: 86  Weight: 180 lb 12.8 oz (82 kg)    Fetal Status: Fetal Heart Rate (bpm): 142 Fundal Height: 30 cm Movement: Present     General:  Alert, oriented and cooperative. Patient is in no acute distress.  Skin: Skin is warm and dry. No rash noted.   Cardiovascular: Normal heart rate noted  Respiratory: Normal respiratory effort, no problems with respiration noted  Abdomen: Soft, gravid, appropriate for gestational age. Pain/Pressure: Present     Pelvic:  Cervical exam deferred        Extremities: Normal range of motion.  Edema: Trace  Mental Status: Normal mood and affect. Normal behavior. Normal judgment and thought content.   Assessment and Plan:  Pregnancy: G2P1001 at 5244w2d  1. Normal pregnancy in multigravida, antepartum Continue routine prenatal care. Scheduled for RCS  Preterm labor symptoms and general obstetric precautions including but not limited to vaginal bleeding, contractions, leaking of fluid and fetal movement were reviewed in detail with the patient. Please refer to After Visit Summary for other counseling recommendations.    Return in 2 weeks (on 05/13/2016).   Reva Boresanya S Brianda Beitler, MD

## 2016-04-29 NOTE — Patient Instructions (Signed)
Breastfeeding Deciding to breastfeed is one of the best choices you can make for you and your baby. A change in hormones during pregnancy causes your breast tissue to grow and increases the number and size of your milk ducts. These hormones also allow proteins, sugars, and fats from your blood supply to make breast milk in your milk-producing glands. Hormones prevent breast milk from being released before your baby is born as well as prompt milk flow after birth. Once breastfeeding has begun, thoughts of your baby, as well as his or her sucking or crying, can stimulate the release of milk from your milk-producing glands. Benefits of breastfeeding For Your Baby  Your first milk (colostrum) helps your baby's digestive system function better.  There are antibodies in your milk that help your baby fight off infections.  Your baby has a lower incidence of asthma, allergies, and sudden infant death syndrome.  The nutrients in breast milk are better for your baby than infant formulas and are designed uniquely for your baby's needs.  Breast milk improves your baby's brain development.  Your baby is less likely to develop other conditions, such as childhood obesity, asthma, or type 2 diabetes mellitus.  For You  Breastfeeding helps to create a very special bond between you and your baby.  Breastfeeding is convenient. Breast milk is always available at the correct temperature and costs nothing.  Breastfeeding helps to burn calories and helps you lose the weight gained during pregnancy.  Breastfeeding makes your uterus contract to its prepregnancy size faster and slows bleeding (lochia) after you give birth.  Breastfeeding helps to lower your risk of developing type 2 diabetes mellitus, osteoporosis, and breast or ovarian cancer later in life.  Signs that your baby is hungry Early Signs of Hunger  Increased alertness or activity.  Stretching.  Movement of the head from side to  side.  Movement of the head and opening of the mouth when the corner of the mouth or cheek is stroked (rooting).  Increased sucking sounds, smacking lips, cooing, sighing, or squeaking.  Hand-to-mouth movements.  Increased sucking of fingers or hands.  Late Signs of Hunger  Fussing.  Intermittent crying.  Extreme Signs of Hunger Signs of extreme hunger will require calming and consoling before your baby will be able to breastfeed successfully. Do not wait for the following signs of extreme hunger to occur before you initiate breastfeeding:  Restlessness.  A loud, strong cry.  Screaming.  Breastfeeding basics Breastfeeding Initiation  Find a comfortable place to sit or lie down, with your neck and back well supported.  Place a pillow or rolled up blanket under your baby to bring him or her to the level of your breast (if you are seated). Nursing pillows are specially designed to help support your arms and your baby while you breastfeed.  Make sure that your baby's abdomen is facing your abdomen.  Gently massage your breast. With your fingertips, massage from your chest wall toward your nipple in a circular motion. This encourages milk flow. You may need to continue this action during the feeding if your milk flows slowly.  Support your breast with 4 fingers underneath and your thumb above your nipple. Make sure your fingers are well away from your nipple and your baby's mouth.  Stroke your baby's lips gently with your finger or nipple.  When your baby's mouth is open wide enough, quickly bring your baby to your breast, placing your entire nipple and as much of the colored area   areola) as possible into your baby's mouth.  More areola should be visible above your baby's upper lip than below the lower lip.  Your baby's tongue should be between his or her lower gum and your breast.  Ensure that your baby's mouth is correctly positioned around your nipple (latched). Your baby's  lips should create a seal on your breast and be turned out (everted).  It is common for your baby to suck about 2-3 minutes in order to start the flow of breast milk. Latching  Teaching your baby how to latch on to your breast properly is very important. An improper latch can cause nipple pain and decreased milk supply for you and poor weight gain in your baby. Also, if your baby is not latched onto your nipple properly, he or she may swallow some air during feeding. This can make your baby fussy. Burping your baby when you switch breasts during the feeding can help to get rid of the air. However, teaching your baby to latch on properly is still the best way to prevent fussiness from swallowing air while breastfeeding. Signs that your baby has successfully latched on to your nipple:  Silent tugging or silent sucking, without causing you pain.  Swallowing heard between every 3-4 sucks.  Muscle movement above and in front of his or her ears while sucking. Signs that your baby has not successfully latched on to nipple:  Sucking sounds or smacking sounds from your baby while breastfeeding.  Nipple pain. If you think your baby has not latched on correctly, slip your finger into the corner of your baby's mouth to break the suction and place it between your baby's gums. Attempt breastfeeding initiation again. Signs of Successful Breastfeeding  Signs from your baby:  A gradual decrease in the number of sucks or complete cessation of sucking.  Falling asleep.  Relaxation of his or her body.  Retention of a small amount of milk in his or her mouth.  Letting go of your breast by himself or herself. Signs from you:  Breasts that have increased in firmness, weight, and size 1-3 hours after feeding.  Breasts that are softer immediately after breastfeeding.  Increased milk volume, as well as a change in milk consistency and color by the fifth day of breastfeeding.  Nipples that are not sore,  cracked, or bleeding. Signs That Your Baby is Getting Enough Milk  Wetting at least 1-2 diapers during the first 24 hours after birth.  Wetting at least 5-6 diapers every 24 hours for the first week after birth. The urine should be clear or pale yellow by 5 days after birth.  Wetting 6-8 diapers every 24 hours as your baby continues to grow and develop.  At least 3 stools in a 24-hour period by age 5 days. The stool should be soft and yellow.  At least 3 stools in a 24-hour period by age 7 days. The stool should be seedy and yellow.  No loss of weight greater than 10% of birth weight during the first 3 days of age.  Average weight gain of 4-7 ounces (113-198 g) per week after age 4 days.  Consistent daily weight gain by age 5 days, without weight loss after the age of 2 weeks. After a feeding, your baby may spit up a small amount. This is common. Breastfeeding frequency and duration Frequent feeding will help you make more milk and can prevent sore nipples and breast engorgement. Breastfeed when you feel the need to reduce the   you feel the need to reduce the fullness of your breasts or when your baby shows signs of hunger. This is called "breastfeeding on demand." Avoid introducing a pacifier to your baby while you are working to establish breastfeeding (the first 4-6 weeks after your baby is born). After this time you may choose to use a pacifier. Research has shown that pacifier use during the first year of a baby's life decreases the risk of sudden infant death syndrome (SIDS). Allow your baby to feed on each breast as long as he or she wants. Breastfeed until your baby is finished feeding. When your baby unlatches or falls asleep while feeding from the first breast, offer the second breast. Because newborns are often sleepy in the first few weeks of life, you may need to awaken your baby to get him or her to feed. Breastfeeding times will vary from baby to baby. However,  the following rules can serve as a guide to help you ensure that your baby is properly fed:  Newborns (babies 4 weeks of age or younger) may breastfeed every 1-3 hours.  Newborns should not go longer than 3 hours during the day or 5 hours during the night without breastfeeding.  You should breastfeed your baby a minimum of 8 times in a 24-hour period until you begin to introduce solid foods to your baby at around 6 months of age.  Breast milk pumping Pumping and storing breast milk allows you to ensure that your baby is exclusively fed your breast milk, even at times when you are unable to breastfeed. This is especially important if you are going back to work while you are still breastfeeding or when you are not able to be present during feedings. Your lactation consultant can give you guidelines on how long it is safe to store breast milk. A breast pump is a machine that allows you to pump milk from your breast into a sterile bottle. The pumped breast milk can then be stored in a refrigerator or freezer. Some breast pumps are operated by hand, while others use electricity. Ask your lactation consultant which type will work best for you. Breast pumps can be purchased, but some hospitals and breastfeeding support groups lease breast pumps on a monthly basis. A lactation consultant can teach you how to hand express breast milk, if you prefer not to use a pump. Caring for your breasts while you breastfeed Nipples can become dry, cracked, and sore while breastfeeding. The following recommendations can help keep your breasts moisturized and healthy:  Avoid using soap on your nipples.  Wear a supportive bra. Although not required, special nursing bras and tank tops are designed to allow access to your breasts for breastfeeding without taking off your entire bra or top. Avoid wearing underwire-style bras or extremely tight bras.  Air dry your nipples for 3-4minutes after each feeding.  Use only cotton  bra pads to absorb leaked breast milk. Leaking of breast milk between feedings is normal.  Use lanolin on your nipples after breastfeeding. Lanolin helps to maintain your skin's normal moisture barrier. If you use pure lanolin, you do not need to wash it off before feeding your baby again. Pure lanolin is not toxic to your baby. You may also hand express a few drops of breast milk and gently massage that milk into your nipples and allow the milk to air dry.  In the first few weeks after giving birth, some women experience extremely full breasts (engorgement). Engorgement can make your   Engorgement peaks within 3-5 days after you give birth. The following recommendations can help ease engorgement:  Completely empty your breasts while breastfeeding or pumping. You may want to start by applying warm, moist heat (in the shower or with warm water-soaked hand towels) just before feeding or pumping. This increases circulation and helps the milk flow. If your baby does not completely empty your breasts while breastfeeding, pump any extra milk after he or she is finished.  Wear a snug bra (nursing or regular) or tank top for 1-2 days to signal your body to slightly decrease milk production.  Apply ice packs to your breasts, unless this is too uncomfortable for you.  Make sure that your baby is latched on and positioned properly while breastfeeding. If engorgement persists after 48 hours of following these recommendations, contact your health care provider or a Advertising copywriterlactation consultant. Overall health care recommendations while breastfeeding  Eat healthy foods. Alternate between meals and snacks, eating 3 of each per day. Because what you eat affects your breast milk, some of the foods may make your baby more irritable than usual. Avoid eating these foods if you are sure that they are negatively affecting your baby.  Drink milk, fruit juice, and water to satisfy your thirst (about 10 glasses a day).  Rest often,  relax, and continue to take your prenatal vitamins to prevent fatigue, stress, and anemia.  Continue breast self-awareness checks.  Avoid chewing and smoking tobacco. Chemicals from cigarettes that pass into breast milk and exposure to secondhand smoke may harm your baby.  Avoid alcohol and drug use, including marijuana. Some medicines that may be harmful to your baby can pass through breast milk. It is important to ask your health care provider before taking any medicine, including all over-the-counter and prescription medicine as well as vitamin and herbal supplements. It is possible to become pregnant while breastfeeding. If birth control is desired, ask your health care provider about options that will be safe for your baby. Contact a health care provider if:  You feel like you want to stop breastfeeding or have become frustrated with breastfeeding.  You have painful breasts or nipples.  Your nipples are cracked or bleeding.  Your breasts are red, tender, or warm.  You have a swollen area on either breast.  You have a fever or chills.  You have nausea or vomiting.  You have drainage other than breast milk from your nipples.  Your breasts do not become full before feedings by the fifth day after you give birth.  You feel sad and depressed.  Your baby is too sleepy to eat well.  Your baby is having trouble sleeping.  Your baby is wetting less than 3 diapers in a 24-hour period.  Your baby has less than 3 stools in a 24-hour period.  Your baby's skin or the white part of his or her eyes becomes yellow.  Your baby is not gaining weight by 485 days of age. Get help right away if:  Your baby is overly tired (lethargic) and does not want to wake up and feed.  Your baby develops an unexplained fever. This information is not intended to replace advice given to you by your health care provider. Make sure you discuss any questions you have with your health care  provider. Document Released: 05/11/2005 Document Revised: 10/23/2015 Document Reviewed: 11/02/2012 [employer] Interactive Patient Education  2017 [employer] Third Trimester of Pregnancy The third trimester is from week 29 through week 40 (months 7 through  9). The third trimester is a time when the unborn baby (fetus) is growing rapidly. At the end of the ninth month, the fetus is about 20 inches in length and weighs 6-10 pounds. Body changes during your third trimester Your body goes through many changes during pregnancy. The changes vary from woman to woman. During the third trimester:  Your weight will continue to increase. You can expect to gain 25-35 pounds (11-16 kg) by the end of the pregnancy.  You may begin to get stretch marks on your hips, abdomen, and breasts.  You may urinate more often because the fetus is moving lower into your pelvis and pressing on your bladder.  You may develop or continue to have heartburn. This is caused by increased hormones that slow down muscles in the digestive tract.  You may develop or continue to have constipation because increased hormones slow digestion and cause the muscles that push waste through your intestines to relax.  You may develop hemorrhoids. These are swollen veins (varicose veins) in the rectum that can itch or be painful.  You may develop swollen, bulging veins (varicose veins) in your legs.  You may have increased body aches in the pelvis, back, or thighs. This is due to weight gain and increased hormones that are relaxing your joints.  You may have changes in your hair. These can include thickening of your hair, rapid growth, and changes in texture. Some women also have hair loss during or after pregnancy, or hair that feels dry or thin. Your hair will most likely return to normal after your baby is born.  Your breasts will continue to grow and they will continue to become tender. A yellow fluid (colostrum) may leak from your  breasts. This is the first milk you are producing for your baby.  Your belly button may stick out.  You may notice more swelling in your hands, face, or ankles.  You may have increased tingling or numbness in your hands, arms, and legs. The skin on your belly may also feel numb.  You may feel short of breath because of your expanding uterus.  You may have more problems sleeping. This can be caused by the size of your belly, increased need to urinate, and an increase in your body's metabolism.  You may notice the fetus "dropping," or moving lower in your abdomen.  You may have increased vaginal discharge.  Your cervix becomes thin and soft (effaced) near your due date. What to expect at prenatal visits You will have prenatal exams every 2 weeks until week 36. Then you will have weekly prenatal exams. During a routine prenatal visit:  You will be weighed to make sure you and the fetus are growing normally.  Your blood pressure will be taken.  Your abdomen will be measured to track your baby's growth.  The fetal heartbeat will be listened to.  Any test results from the previous visit will be discussed.  You may have a cervical check near your due date to see if you have effaced. At around 36 weeks, your health care provider will check your cervix. At the same time, your health care provider will also perform a test on the secretions of the vaginal tissue. This test is to determine if a type of bacteria, Group B streptococcus, is present. Your health care provider will explain this further. Your health care provider may ask you:  What your birth plan is.  How you are feeling.  If you are feeling the  baby move.  If you have had any abnormal symptoms, such as leaking fluid, bleeding, severe headaches, or abdominal cramping.  If you are using any tobacco products, including cigarettes, chewing tobacco, and electronic cigarettes.  If you have any questions. Other tests or  screenings that may be performed during your third trimester include:  Blood tests that check for low iron levels (anemia).  Fetal testing to check the health, activity level, and growth of the fetus. Testing is done if you have certain medical conditions or if there are problems during the pregnancy.  Nonstress test (NST). This test checks the health of your baby to make sure there are no signs of problems, such as the baby not getting enough oxygen. During this test, a belt is placed around your belly. The baby is made to move, and its heart rate is monitored during movement. What is false labor? False labor is a condition in which you feel small, irregular tightenings of the muscles in the womb (contractions) that eventually go away. These are called Braxton Hicks contractions. Contractions may last for hours, days, or even weeks before true labor sets in. If contractions come at regular intervals, become more frequent, increase in intensity, or become painful, you should see your health care provider. What are the signs of labor?  Abdominal cramps.  Regular contractions that start at 10 minutes apart and become stronger and more frequent with time.  Contractions that start on the top of the uterus and spread down to the lower abdomen and back.  Increased pelvic pressure and dull back pain.  A watery or bloody mucus discharge that comes from the vagina.  Leaking of amniotic fluid. This is also known as your "water breaking." It could be a slow trickle or a gush. Let your doctor know if it has a color or strange odor. If you have any of these signs, call your health care provider right away, even if it is before your due date. Follow these instructions at home: Eating and drinking  Continue to eat regular, healthy meals.  Do not eat:  Raw meat or meat spreads.  Unpasteurized milk or cheese.  Unpasteurized juice.  Store-made salad.  Refrigerated smoked seafood.  Hot dogs or  deli meat, unless they are piping hot.  More than 6 ounces of albacore tuna a week.  Shark, swordfish, king mackerel, or tile fish.  Store-made salads.  Raw sprouts, such as mung bean or alfalfa sprouts.  Take prenatal vitamins as told by your health care provider.  Take 1000 mg of calcium daily as told by your health care provider.  If you develop constipation:  Take over-the-counter or prescription medicines.  Drink enough fluid to keep your urine clear or pale yellow.  Eat foods that are high in fiber, such as fresh fruits and vegetables, whole grains, and beans.  Limit foods that are high in fat and processed sugars, such as fried and sweet foods. Activity  Exercise only as directed by your health care provider. Healthy pregnant women should aim for 2 hours and 30 minutes of moderate exercise per week. If you experience any pain or discomfort while exercising, stop.  Avoid heavy lifting.  Do not exercise in extreme heat or humidity, or at high altitudes.  Wear low-heel, comfortable shoes.  Practice good posture.  Do not travel far distances unless it is absolutely necessary and only with the approval of your health care provider.  Wear your seat belt at all times while in a  car, on a bus, or on a plane.  Take frequent breaks and rest with your legs elevated if you have leg cramps or low back pain.  Do not use hot tubs, steam rooms, or saunas.  You may continue to have sex unless your health care provider tells you otherwise. Lifestyle  Do not use any products that contain nicotine or tobacco, such as cigarettes and e-cigarettes. If you need help quitting, ask your health care provider.  Do not drink alcohol.  Do not use any medicinal herbs or unprescribed drugs. These chemicals affect the formation and growth of the baby.  If you develop varicose veins:  Wear support pantyhose or compression stockings as told by your healthcare provider.  Elevate your feet  for 15 minutes, 3-4 times a day.  Wear a supportive maternity bra to help with breast tenderness. General instructions  Take over-the-counter and prescription medicines only as told by your health care provider. There are medicines that are either safe or unsafe to take during pregnancy.  Take warm sitz baths to soothe any pain or discomfort caused by hemorrhoids. Use hemorrhoid cream or witch hazel if your health care provider approves.  Avoid cat litter boxes and soil used by cats. These carry germs that can cause birth defects in the baby. If you have a cat, ask someone to clean the litter box for you.  To prepare for the arrival of your baby:  Take prenatal classes to understand, practice, and ask questions about the labor and delivery.  Make a trial run to the hospital.  Visit the hospital and tour the maternity area.  Arrange for maternity or paternity leave through employers.  Arrange for family and friends to take care of pets while you are in the hospital.  Purchase a rear-facing car seat and make sure you know how to install it in your car.  Pack your hospital bag.  Prepare the baby's nursery. Make sure to remove all pillows and stuffed animals from the baby's crib to prevent suffocation.  Visit your dentist if you have not gone during your pregnancy. Use a soft toothbrush to brush your teeth and be gentle when you floss.  Keep all prenatal follow-up visits as told by your health care provider. This is important. Contact a health care provider if:  You are unsure if you are in labor or if your water has broken.  You become dizzy.  You have mild pelvic cramps, pelvic pressure, or nagging pain in your abdominal area.  You have lower back pain.  You have persistent nausea, vomiting, or diarrhea.  You have an unusual or bad smelling vaginal discharge.  You have pain when you urinate. Get help right away if:  You have a fever.  You are leaking fluid from your  vagina.  You have spotting or bleeding from your vagina.  You have severe abdominal pain or cramping.  You have rapid weight loss or weight gain.  You have shortness of breath with chest pain.  You notice sudden or extreme swelling of your face, hands, ankles, feet, or legs.  Your baby makes fewer than 10 movements in 2 hours.  You have severe headaches that do not go away with medicine.  You have vision changes. Summary  The third trimester is from week 29 through week 40, months 7 through 9. The third trimester is a time when the unborn baby (fetus) is growing rapidly.  During the third trimester, your discomfort may increase as you and your  baby continue to gain weight. You may have abdominal, leg, and back pain, sleeping problems, and an increased need to urinate.  During the third trimester your breasts will keep growing and they will continue to become tender. A yellow fluid (colostrum) may leak from your breasts. This is the first milk you are producing for your baby.  False labor is a condition in which you feel small, irregular tightenings of the muscles in the womb (contractions) that eventually go away. These are called Braxton Hicks contractions. Contractions may last for hours, days, or even weeks before true labor sets in.  Signs of labor can include: abdominal cramps; regular contractions that start at 10 minutes apart and become stronger and more frequent with time; watery or bloody mucus discharge that comes from the vagina; increased pelvic pressure and dull back pain; and leaking of amniotic fluid. This information is not intended to replace advice given to you by your health care provider. Make sure you discuss any questions you have with your health care provider. Document Released: 05/05/2001 Document Revised: 10/17/2015 Document Reviewed: 07/12/2012 Elsevier Interactive Patient Education  2017 [employer].

## 2016-05-13 ENCOUNTER — Ambulatory Visit (INDEPENDENT_AMBULATORY_CARE_PROVIDER_SITE_OTHER): Payer: Self-pay | Admitting: Student

## 2016-05-13 VITALS — BP 121/83 | HR 84 | Wt 185.2 lb

## 2016-05-13 DIAGNOSIS — O09299 Supervision of pregnancy with other poor reproductive or obstetric history, unspecified trimester: Secondary | ICD-10-CM

## 2016-05-13 DIAGNOSIS — O09293 Supervision of pregnancy with other poor reproductive or obstetric history, third trimester: Secondary | ICD-10-CM

## 2016-05-13 DIAGNOSIS — Z8632 Personal history of gestational diabetes: Secondary | ICD-10-CM

## 2016-05-13 DIAGNOSIS — Z3480 Encounter for supervision of other normal pregnancy, unspecified trimester: Secondary | ICD-10-CM

## 2016-05-13 DIAGNOSIS — O34219 Maternal care for unspecified type scar from previous cesarean delivery: Secondary | ICD-10-CM

## 2016-05-13 NOTE — Progress Notes (Signed)
   PRENATAL VISIT NOTE  Subjective:  Ashley Curtis is a 30 y.o. G2P1001 at 3389w2d being seen today for ongoing prenatal care.  She is currently monitored for the following issues for this low-risk pregnancy and has Anxiety; Migraine; Normal pregnancy in multigravida, antepartum; Previous cesarean section complicating pregnancy, antepartum condition or complication; and Previous gestational diabetes mellitus, antepartum on her problem list.  Patient reports cramping.  Contractions: Irritability. Vag. Bleeding: None.  Movement: Present. Denies leaking of fluid.   The following portions of the patient's history were reviewed and updated as appropriate: allergies, current medications, past family history, past medical history, past social history, past surgical history and problem list. Problem list updated.  Objective:   Vitals:   05/13/16 1537  BP: 121/83  Pulse: 84  Weight: 185 lb 3.2 oz (84 kg)    Fetal Status: Fetal Heart Rate (bpm): 141   Movement: Present     General:  Alert, oriented and cooperative. Patient is in no acute distress.  Skin: Skin is warm and dry. No rash noted.   Cardiovascular: Normal heart rate noted  Respiratory: Normal respiratory effort, no problems with respiration noted  Abdomen: Soft, gravid, appropriate for gestational age. Pain/Pressure: Present     Pelvic:  Cervical exam deferred        Extremities: Normal range of motion.  Edema: Trace  Mental Status: Normal mood and affect. Normal behavior. Normal judgment and thought content.   Assessment and Plan:  Pregnancy: G2P1001 at 3789w2d  1. Normal pregnancy in multigravida, antepartum Feeling well, no complaints other than heartburn. Encouraged her to continue to drink water.   2. Previous cesarean section complicating pregnancy, antepartum condition or complication Scheduled for 1/29  3. Previous gestational diabetes mellitus, antepartum -Will schedule patient asap for GTT   Preterm labor symptoms and  general obstetric precautions including but not limited to vaginal bleeding, contractions, leaking of fluid and fetal movement were reviewed in detail with the patient. Please refer to After Visit Summary for other counseling recommendations.  Return in about 2 weeks (around 05/27/2016) for ASAP for fasting 2 hr gtt and in 2 weeks with provider. Marylene Land.   Kathryn Lorraine Kooistra, CNM

## 2016-05-13 NOTE — Patient Instructions (Signed)
Third Trimester of Pregnancy The third trimester is from week 29 through week 40 (months 7 through 9). The third trimester is a time when the unborn baby (fetus) is growing rapidly. At the end of the ninth month, the fetus is about 20 inches in length and weighs 6-10 pounds. Body changes during your third trimester Your body goes through many changes during pregnancy. The changes vary from woman to woman. During the third trimester:  Your weight will continue to increase. You can expect to gain 25-35 pounds (11-16 kg) by the end of the pregnancy.  You may begin to get stretch marks on your hips, abdomen, and breasts.  You may urinate more often because the fetus is moving lower into your pelvis and pressing on your bladder.  You may develop or continue to have heartburn. This is caused by increased hormones that slow down muscles in the digestive tract.  You may develop or continue to have constipation because increased hormones slow digestion and cause the muscles that push waste through your intestines to relax.  You may develop hemorrhoids. These are swollen veins (varicose veins) in the rectum that can itch or be painful.  You may develop swollen, bulging veins (varicose veins) in your legs.  You may have increased body aches in the pelvis, back, or thighs. This is due to weight gain and increased hormones that are relaxing your joints.  You may have changes in your hair. These can include thickening of your hair, rapid growth, and changes in texture. Some women also have hair loss during or after pregnancy, or hair that feels dry or thin. Your hair will most likely return to normal after your baby is born.  Your breasts will continue to grow and they will continue to become tender. A yellow fluid (colostrum) may leak from your breasts. This is the first milk you are producing for your baby.  Your belly button may stick out.  You may notice more swelling in your hands, face, or  ankles.  You may have increased tingling or numbness in your hands, arms, and legs. The skin on your belly may also feel numb.  You may feel short of breath because of your expanding uterus.  You may have more problems sleeping. This can be caused by the size of your belly, increased need to urinate, and an increase in your body's metabolism.  You may notice the fetus "dropping," or moving lower in your abdomen.  You may have increased vaginal discharge.  Your cervix becomes thin and soft (effaced) near your due date. What to expect at prenatal visits You will have prenatal exams every 2 weeks until week 36. Then you will have weekly prenatal exams. During a routine prenatal visit:  You will be weighed to make sure you and the fetus are growing normally.  Your blood pressure will be taken.  Your abdomen will be measured to track your baby's growth.  The fetal heartbeat will be listened to.  Any test results from the previous visit will be discussed.  You may have a cervical check near your due date to see if you have effaced. At around 36 weeks, your health care provider will check your cervix. At the same time, your health care provider will also perform a test on the secretions of the vaginal tissue. This test is to determine if a type of bacteria, Group B streptococcus, is present. Your health care provider will explain this further. Your health care provider may ask you:    What your birth plan is.  How you are feeling.  If you are feeling the baby move.  If you have had any abnormal symptoms, such as leaking fluid, bleeding, severe headaches, or abdominal cramping.  If you are using any tobacco products, including cigarettes, chewing tobacco, and electronic cigarettes.  If you have any questions. Other tests or screenings that may be performed during your third trimester include:  Blood tests that check for low iron levels (anemia).  Fetal testing to check the health,  activity level, and growth of the fetus. Testing is done if you have certain medical conditions or if there are problems during the pregnancy.  Nonstress test (NST). This test checks the health of your baby to make sure there are no signs of problems, such as the baby not getting enough oxygen. During this test, a belt is placed around your belly. The baby is made to move, and its heart rate is monitored during movement. What is false labor? False labor is a condition in which you feel small, irregular tightenings of the muscles in the womb (contractions) that eventually go away. These are called Braxton Hicks contractions. Contractions may last for hours, days, or even weeks before true labor sets in. If contractions come at regular intervals, become more frequent, increase in intensity, or become painful, you should see your health care provider. What are the signs of labor?  Abdominal cramps.  Regular contractions that start at 10 minutes apart and become stronger and more frequent with time.  Contractions that start on the top of the uterus and spread down to the lower abdomen and back.  Increased pelvic pressure and dull back pain.  A watery or bloody mucus discharge that comes from the vagina.  Leaking of amniotic fluid. This is also known as your "water breaking." It could be a slow trickle or a gush. Let your doctor know if it has a color or strange odor. If you have any of these signs, call your health care provider right away, even if it is before your due date. Follow these instructions at home: Eating and drinking  Continue to eat regular, healthy meals.  Do not eat:  Raw meat or meat spreads.  Unpasteurized milk or cheese.  Unpasteurized juice.  Store-made salad.  Refrigerated smoked seafood.  Hot dogs or deli meat, unless they are piping hot.  More than 6 ounces of albacore tuna a week.  Shark, swordfish, king mackerel, or tile fish.  Store-made salads.  Raw  sprouts, such as mung bean or alfalfa sprouts.  Take prenatal vitamins as told by your health care provider.  Take 1000 mg of calcium daily as told by your health care provider.  If you develop constipation:  Take over-the-counter or prescription medicines.  Drink enough fluid to keep your urine clear or pale yellow.  Eat foods that are high in fiber, such as fresh fruits and vegetables, whole grains, and beans.  Limit foods that are high in fat and processed sugars, such as fried and sweet foods. Activity  Exercise only as directed by your health care provider. Healthy pregnant women should aim for 2 hours and 30 minutes of moderate exercise per week. If you experience any pain or discomfort while exercising, stop.  Avoid heavy lifting.  Do not exercise in extreme heat or humidity, or at high altitudes.  Wear low-heel, comfortable shoes.  Practice good posture.  Do not travel far distances unless it is absolutely necessary and only with the approval   of your health care provider.  Wear your seat belt at all times while in a car, on a bus, or on a plane.  Take frequent breaks and rest with your legs elevated if you have leg cramps or low back pain.  Do not use hot tubs, steam rooms, or saunas.  You may continue to have sex unless your health care provider tells you otherwise. Lifestyle  Do not use any products that contain nicotine or tobacco, such as cigarettes and e-cigarettes. If you need help quitting, ask your health care provider.  Do not drink alcohol.  Do not use any medicinal herbs or unprescribed drugs. These chemicals affect the formation and growth of the baby.  If you develop varicose veins:  Wear support pantyhose or compression stockings as told by your healthcare provider.  Elevate your feet for 15 minutes, 3-4 times a day.  Wear a supportive maternity bra to help with breast tenderness. General instructions  Take over-the-counter and prescription  medicines only as told by your health care provider. There are medicines that are either safe or unsafe to take during pregnancy.  Take warm sitz baths to soothe any pain or discomfort caused by hemorrhoids. Use hemorrhoid cream or witch hazel if your health care provider approves.  Avoid cat litter boxes and soil used by cats. These carry germs that can cause birth defects in the baby. If you have a cat, ask someone to clean the litter box for you.  To prepare for the arrival of your baby:  Take prenatal classes to understand, practice, and ask questions about the labor and delivery.  Make a trial run to the hospital.  Visit the hospital and tour the maternity area.  Arrange for maternity or paternity leave through employers.  Arrange for family and friends to take care of pets while you are in the hospital.  Purchase a rear-facing car seat and make sure you know how to install it in your car.  Pack your hospital bag.  Prepare the baby's nursery. Make sure to remove all pillows and stuffed animals from the baby's crib to prevent suffocation.  Visit your dentist if you have not gone during your pregnancy. Use a soft toothbrush to brush your teeth and be gentle when you floss.  Keep all prenatal follow-up visits as told by your health care provider. This is important. Contact a health care provider if:  You are unsure if you are in labor or if your water has broken.  You become dizzy.  You have mild pelvic cramps, pelvic pressure, or nagging pain in your abdominal area.  You have lower back pain.  You have persistent nausea, vomiting, or diarrhea.  You have an unusual or bad smelling vaginal discharge.  You have pain when you urinate. Get help right away if:  You have a fever.  You are leaking fluid from your vagina.  You have spotting or bleeding from your vagina.  You have severe abdominal pain or cramping.  You have rapid weight loss or weight gain.  You have  shortness of breath with chest pain.  You notice sudden or extreme swelling of your face, hands, ankles, feet, or legs.  Your baby makes fewer than 10 movements in 2 hours.  You have severe headaches that do not go away with medicine.  You have vision changes. Summary  The third trimester is from week 29 through week 40, months 7 through 9. The third trimester is a time when the unborn baby (fetus)   is growing rapidly.  During the third trimester, your discomfort may increase as you and your baby continue to gain weight. You may have abdominal, leg, and back pain, sleeping problems, and an increased need to urinate.  During the third trimester your breasts will keep growing and they will continue to become tender. A yellow fluid (colostrum) may leak from your breasts. This is the first milk you are producing for your baby.  False labor is a condition in which you feel small, irregular tightenings of the muscles in the womb (contractions) that eventually go away. These are called Braxton Hicks contractions. Contractions may last for hours, days, or even weeks before true labor sets in.  Signs of labor can include: abdominal cramps; regular contractions that start at 10 minutes apart and become stronger and more frequent with time; watery or bloody mucus discharge that comes from the vagina; increased pelvic pressure and dull back pain; and leaking of amniotic fluid. This information is not intended to replace advice given to you by your health care provider. Make sure you discuss any questions you have with your health care provider. Document Released: 05/05/2001 Document Revised: 10/17/2015 Document Reviewed: 07/12/2012 Elsevier Interactive Patient Education  2017 [employer]  

## 2016-05-15 ENCOUNTER — Other Ambulatory Visit: Payer: Self-pay

## 2016-05-27 ENCOUNTER — Ambulatory Visit (INDEPENDENT_AMBULATORY_CARE_PROVIDER_SITE_OTHER): Payer: Self-pay | Admitting: Obstetrics and Gynecology

## 2016-05-27 VITALS — BP 120/73 | HR 88 | Wt 183.6 lb

## 2016-05-27 DIAGNOSIS — Z3480 Encounter for supervision of other normal pregnancy, unspecified trimester: Secondary | ICD-10-CM

## 2016-05-27 DIAGNOSIS — Z113 Encounter for screening for infections with a predominantly sexual mode of transmission: Secondary | ICD-10-CM | POA: Diagnosis present

## 2016-05-27 DIAGNOSIS — O09299 Supervision of pregnancy with other poor reproductive or obstetric history, unspecified trimester: Secondary | ICD-10-CM

## 2016-05-27 DIAGNOSIS — Z8632 Personal history of gestational diabetes: Secondary | ICD-10-CM

## 2016-05-27 DIAGNOSIS — O09293 Supervision of pregnancy with other poor reproductive or obstetric history, third trimester: Secondary | ICD-10-CM

## 2016-05-27 DIAGNOSIS — O34219 Maternal care for unspecified type scar from previous cesarean delivery: Secondary | ICD-10-CM

## 2016-05-27 LAB — OB RESULTS CONSOLE GBS: GBS: POSITIVE

## 2016-05-27 NOTE — Progress Notes (Signed)
Subjective:  Ashley Curtis is a 31 y.o. G2P1001 at 3128w2d being seen today for ongoing prenatal care.  She is currently monitored for the following issues for this low-risk pregnancy and has Anxiety; Migraine; Normal pregnancy in multigravida, antepartum; Previous cesarean section complicating pregnancy, antepartum condition or complication; and Previous gestational diabetes mellitus, antepartum on her problem list.  Patient reports backache.  Contractions: Irregular. Vag. Bleeding: None.  Movement: Present. Denies leaking of fluid.   The following portions of the patient's history were reviewed and updated as appropriate: allergies, current medications, past family history, past medical history, past social history, past surgical history and problem list. Problem list updated.  Objective:   Vitals:   05/27/16 1527  BP: 120/73  Pulse: 88  Weight: 183 lb 9.6 oz (83.3 kg)    Fetal Status: Fetal Heart Rate (bpm): 154   Movement: Present     General:  Alert, oriented and cooperative. Patient is in no acute distress.  Skin: Skin is warm and dry. No rash noted.   Cardiovascular: Normal heart rate noted  Respiratory: Normal respiratory effort, no problems with respiration noted  Abdomen: Soft, gravid, appropriate for gestational age. Pain/Pressure: Present     Pelvic:  Cervical exam performed        Extremities: Normal range of motion.  Edema: Mild pitting, slight indentation  Mental Status: Normal mood and affect. Normal behavior. Normal judgment and thought content.   Urinalysis:      Assessment and Plan:  Pregnancy: G2P1001 at 6128w2d  1. Normal pregnancy in multigravida, antepartum GBS,GC/C today  2. Previous gestational diabetes mellitus, antepartum Normal 3 hr GTT  3. Previous cesarean section complicating pregnancy, antepartum condition or complication RLTCS scheduled  Preterm labor symptoms and general obstetric precautions including but not limited to vaginal bleeding,  contractions, leaking of fluid and fetal movement were reviewed in detail with the patient. Please refer to After Visit Summary for other counseling recommendations.  Return in about 1 week (around 06/03/2016) for OB visit.   Hermina StaggersMichael L Jigar Zielke, MD

## 2016-05-28 LAB — GC/CHLAMYDIA PROBE AMP (~~LOC~~) NOT AT ARMC
Chlamydia: NEGATIVE
Neisseria Gonorrhea: NEGATIVE

## 2016-05-29 LAB — CULTURE, BETA STREP (GROUP B ONLY)

## 2016-06-04 ENCOUNTER — Ambulatory Visit (INDEPENDENT_AMBULATORY_CARE_PROVIDER_SITE_OTHER): Payer: Self-pay | Admitting: Obstetrics and Gynecology

## 2016-06-04 VITALS — BP 127/77 | HR 84 | Wt 186.9 lb

## 2016-06-04 DIAGNOSIS — Z3483 Encounter for supervision of other normal pregnancy, third trimester: Secondary | ICD-10-CM

## 2016-06-04 DIAGNOSIS — B3731 Acute candidiasis of vulva and vagina: Secondary | ICD-10-CM

## 2016-06-04 DIAGNOSIS — O34219 Maternal care for unspecified type scar from previous cesarean delivery: Secondary | ICD-10-CM

## 2016-06-04 DIAGNOSIS — O98813 Other maternal infectious and parasitic diseases complicating pregnancy, third trimester: Secondary | ICD-10-CM

## 2016-06-04 DIAGNOSIS — Z3480 Encounter for supervision of other normal pregnancy, unspecified trimester: Secondary | ICD-10-CM

## 2016-06-04 DIAGNOSIS — O99820 Streptococcus B carrier state complicating pregnancy: Secondary | ICD-10-CM

## 2016-06-04 DIAGNOSIS — B373 Candidiasis of vulva and vagina: Secondary | ICD-10-CM

## 2016-06-04 NOTE — Progress Notes (Signed)
Cultures done at last visit. Pt reports some period like cramping and low back pain.

## 2016-06-04 NOTE — Progress Notes (Signed)
Prenatal Visit Note Date: 06/04/2016 Clinic: [facility]  Subjective:  Ashley Curtis is a 31 y.o. G2P1001 at 4262w3d being seen today for ongoing prenatal care.  She is currently monitored for the following issues for this low-risk pregnancy and has Anxiety; Migraine; Normal pregnancy in multigravida, antepartum; Previous cesarean section complicating pregnancy, antepartum condition or complication; Previous gestational diabetes mellitus, antepartum; and GBS (group B Streptococcus carrier), +RV culture, currently pregnant on her problem list.  Patient reports occasional UCs and vaginal discharge that sometimes comes with coughing. No VB Contractions: Irregular. Vag. Bleeding: None.  Movement: Present. Denies leaking of fluid.   The following portions of the patient's history were reviewed and updated as appropriate: allergies, current medications, past family history, past medical history, past social history, past surgical history and problem list. Problem list updated.  Objective:   Vitals:   06/04/16 1524  BP: 127/77  Pulse: 84  Weight: 186 lb 14.4 oz (84.8 kg)    Fetal Status: Fetal Heart Rate (bpm): 150 Fundal Height: 36 cm Movement: Present  Presentation: Vertex  General:  Alert, oriented and cooperative. Patient is in no acute distress.  Skin: Skin is warm and dry. No rash noted.   Cardiovascular: Normal heart rate noted  Respiratory: Normal respiratory effort, no problems with respiration noted  Abdomen: Soft, gravid, appropriate for gestational age. Pain/Pressure: Present     Pelvic:  mild b/l erythema on l. Minora and majora. White cottage cheese d/c in vault and introitus. cx visually closed. Minimal white d/c, negative cough test.   Extremities: Normal range of motion.  Edema: Trace  Mental Status: Normal mood and affect. Normal behavior. Normal judgment and thought content.   Urinalysis:      Assessment and Plan:  Pregnancy: G2P1001 at 3162w3d  1.  Vulvovaginal candidiasis OTC monistat  2. Normal pregnancy in multigravida, antepartum Routine care. Nexplanon. Negative ROM evaluation  3. Previous cesarean section complicating pregnancy, antepartum condition or complication Still desires repeat  4. GBS (group B Streptococcus carrier), +RV culture, currently pregnant  Preterm labor symptoms and general obstetric precautions including but not limited to vaginal bleeding, contractions, leaking of fluid and fetal movement were reviewed in detail with the patient. Please refer to After Visit Summary for other counseling recommendations.  Return in about 1 week (around 06/11/2016) for 7-10d.   Guttenberg Bingharlie Qasim Diveley, MD

## 2016-06-11 ENCOUNTER — Encounter: Payer: Self-pay | Admitting: Obstetrics and Gynecology

## 2016-06-12 ENCOUNTER — Encounter (HOSPITAL_COMMUNITY): Payer: Self-pay

## 2016-06-17 ENCOUNTER — Ambulatory Visit (INDEPENDENT_AMBULATORY_CARE_PROVIDER_SITE_OTHER): Payer: Self-pay | Admitting: Obstetrics & Gynecology

## 2016-06-17 VITALS — BP 135/87 | HR 94 | Wt 186.5 lb

## 2016-06-17 DIAGNOSIS — O34219 Maternal care for unspecified type scar from previous cesarean delivery: Secondary | ICD-10-CM

## 2016-06-17 DIAGNOSIS — Z3491 Encounter for supervision of normal pregnancy, unspecified, first trimester: Secondary | ICD-10-CM

## 2016-06-17 NOTE — Patient Instructions (Signed)
Return to clinic for any scheduled appointments or obstetric concerns, or go to MAU for evaluation  

## 2016-06-17 NOTE — Progress Notes (Signed)
   PRENATAL VISIT NOTE  Subjective:  Ashley Curtis is a 31 y.o. G2P1001 at 7031w2d being seen today for ongoing prenatal care.  She is currently monitored for the following issues for this low-risk pregnancy and has Anxiety; Migraine; Normal pregnancy in multigravida, antepartum; Previous cesarean section complicating pregnancy, antepartum condition or complication; Previous gestational diabetes mellitus, antepartum; and GBS (group B Streptococcus carrier), +RV culture, currently pregnant on her problem list.  Patient reports occasional contractions.  Contractions: Irregular. Vag. Bleeding: None.  Movement: Present. Denies leaking of fluid.   The following portions of the patient's history were reviewed and updated as appropriate: allergies, current medications, past family history, past medical history, past social history, past surgical history and problem list. Problem list updated.  Objective:   Vitals:   06/17/16 1243 06/17/16 1248  BP: (!) 134/98 135/87  Pulse: 94   Weight: 186 lb 8 oz (84.6 kg)     Fetal Status: Fetal Heart Rate (bpm): 138 Fundal Height: 37 cm Movement: Present  Presentation: Vertex  General:  Alert, oriented and cooperative. Patient is in no acute distress.  Skin: Skin is warm and dry. No rash noted.   Cardiovascular: Normal heart rate noted  Respiratory: Normal respiratory effort, no problems with respiration noted  Abdomen: Soft, gravid, appropriate for gestational age. Pain/Pressure: Present     Pelvic:  Cervical exam performed Dilation: Closed Effacement (%): Thick Station: Ballotable  Extremities: Normal range of motion.  Edema: Trace  Mental Status: Normal mood and affect. Normal behavior. Normal judgment and thought content.   Assessment and Plan:  Pregnancy: G2P1001 at 3131w2d  1. Previous cesarean section complicating pregnancy, antepartum condition or complication Already scheduled for RCS on 06/23/15  2. Supervision of low-risk pregnancy, first  trimester Term labor symptoms and general obstetric precautions including but not limited to vaginal bleeding, contractions, leaking of fluid and fetal movement were reviewed in detail with the patient. Please refer to After Visit Summary for other counseling recommendations.  Return in about 6 weeks (around 07/29/2016) for Postpartum check.   Tereso NewcomerUgonna A La Shehan, MD

## 2016-06-18 ENCOUNTER — Inpatient Hospital Stay (HOSPITAL_COMMUNITY): Payer: Medicaid Other | Admitting: Anesthesiology

## 2016-06-18 ENCOUNTER — Encounter (HOSPITAL_COMMUNITY): Payer: Self-pay

## 2016-06-18 ENCOUNTER — Inpatient Hospital Stay (HOSPITAL_COMMUNITY)
Admission: AD | Admit: 2016-06-18 | Discharge: 2016-06-20 | DRG: 766 | Disposition: A | Discharge status: Home / Self Care | Payer: Medicaid Other | Source: Ambulatory Visit | Attending: Obstetrics and Gynecology | Admitting: Obstetrics and Gynecology

## 2016-06-18 ENCOUNTER — Encounter (HOSPITAL_COMMUNITY)
Admission: AD | Disposition: A | Discharge status: Home / Self Care | Payer: Self-pay | Source: Ambulatory Visit | Attending: Obstetrics and Gynecology

## 2016-06-18 ENCOUNTER — Telehealth: Payer: Self-pay | Admitting: *Deleted

## 2016-06-18 DIAGNOSIS — O9081 Anemia of the puerperium: Secondary | ICD-10-CM | POA: Diagnosis not present

## 2016-06-18 DIAGNOSIS — O34211 Maternal care for low transverse scar from previous cesarean delivery: Secondary | ICD-10-CM | POA: Diagnosis not present

## 2016-06-18 DIAGNOSIS — O34219 Maternal care for unspecified type scar from previous cesarean delivery: Secondary | ICD-10-CM

## 2016-06-18 DIAGNOSIS — Z3A38 38 weeks gestation of pregnancy: Secondary | ICD-10-CM

## 2016-06-18 DIAGNOSIS — Z87891 Personal history of nicotine dependence: Secondary | ICD-10-CM | POA: Diagnosis not present

## 2016-06-18 DIAGNOSIS — D649 Anemia, unspecified: Secondary | ICD-10-CM

## 2016-06-18 DIAGNOSIS — O99824 Streptococcus B carrier state complicating childbirth: Secondary | ICD-10-CM | POA: Diagnosis present

## 2016-06-18 DIAGNOSIS — O902 Hematoma of obstetric wound: Secondary | ICD-10-CM

## 2016-06-18 DIAGNOSIS — O1414 Severe pre-eclampsia complicating childbirth: Principal | ICD-10-CM | POA: Diagnosis present

## 2016-06-18 DIAGNOSIS — R030 Elevated blood-pressure reading, without diagnosis of hypertension: Secondary | ICD-10-CM | POA: Diagnosis present

## 2016-06-18 DIAGNOSIS — O9902 Anemia complicating childbirth: Secondary | ICD-10-CM | POA: Diagnosis not present

## 2016-06-18 DIAGNOSIS — Z3480 Encounter for supervision of other normal pregnancy, unspecified trimester: Secondary | ICD-10-CM

## 2016-06-18 DIAGNOSIS — O1493 Unspecified pre-eclampsia, third trimester: Secondary | ICD-10-CM

## 2016-06-18 DIAGNOSIS — O1410 Severe pre-eclampsia, unspecified trimester: Secondary | ICD-10-CM | POA: Diagnosis present

## 2016-06-18 LAB — COMPREHENSIVE METABOLIC PANEL
ALBUMIN: 3 g/dL — AB (ref 3.5–5.0)
ALT: 10 U/L — ABNORMAL LOW (ref 14–54)
ANION GAP: 8 (ref 5–15)
AST: 17 U/L (ref 15–41)
Alkaline Phosphatase: 136 U/L — ABNORMAL HIGH (ref 38–126)
BUN: <5 mg/dL — ABNORMAL LOW (ref 6–20)
CHLORIDE: 106 mmol/L (ref 101–111)
CO2: 20 mmol/L — AB (ref 22–32)
Calcium: 8.4 mg/dL — ABNORMAL LOW (ref 8.9–10.3)
Creatinine, Ser: 0.43 mg/dL — ABNORMAL LOW (ref 0.44–1.00)
GFR calc non Af Amer: >60 mL/min (ref >60)
GLUCOSE: 80 mg/dL (ref 65–99)
POTASSIUM: 3.2 mmol/L — AB (ref 3.5–5.1)
SODIUM: 134 mmol/L — AB (ref 135–145)
Total Bilirubin: 0.3 mg/dL (ref 0.3–1.2)
Total Protein: 6.7 g/dL (ref 6.5–8.1)

## 2016-06-18 LAB — URINALYSIS, ROUTINE W REFLEX MICROSCOPIC
BILIRUBIN URINE: NEGATIVE
Glucose, UA: NEGATIVE mg/dL
HGB URINE DIPSTICK: NEGATIVE
Ketones, ur: 5 mg/dL — AB
Nitrite: NEGATIVE
PH: 7 (ref 5.0–8.0)
Protein, ur: 30 mg/dL — AB
Specific Gravity, Urine: 1.016 (ref 1.005–1.030)

## 2016-06-18 LAB — CBC WITH DIFFERENTIAL/PLATELET
Basophils Absolute: 0 10*3/uL (ref 0.0–0.1)
Basophils Relative: 0 %
EOS ABS: 0.1 10*3/uL (ref 0.0–0.7)
EOS PCT: 1 %
HCT: 23.4 % — ABNORMAL LOW (ref 36.0–46.0)
HEMOGLOBIN: 7.3 g/dL — AB (ref 12.0–15.0)
Lymphocytes Relative: 27 %
Lymphs Abs: 1.9 10*3/uL (ref 0.7–4.0)
MCH: 22.4 pg — AB (ref 26.0–34.0)
MCHC: 31.2 g/dL (ref 30.0–36.0)
MCV: 71.8 fL — ABNORMAL LOW (ref 78.0–100.0)
MONOS PCT: 3 %
Monocytes Absolute: 0.2 10*3/uL (ref 0.1–1.0)
NEUTROS PCT: 69 %
Neutro Abs: 5 10*3/uL (ref 1.7–7.7)
PLATELETS: 234 10*3/uL (ref 150–400)
RBC: 3.26 MIL/uL — ABNORMAL LOW (ref 3.87–5.11)
RDW: 16.2 % — AB (ref 11.5–15.5)
WBC: 7.2 10*3/uL (ref 4.0–10.5)

## 2016-06-18 LAB — PREPARE RBC (CROSSMATCH)

## 2016-06-18 LAB — PROTEIN / CREATININE RATIO, URINE
Creatinine, Urine: 150 mg/dL
PROTEIN CREATININE RATIO: 0.25 mg/mg{creat} — AB (ref 0.00–0.15)
TOTAL PROTEIN, URINE: 37 mg/dL

## 2016-06-18 LAB — URIC ACID: URIC ACID, SERUM: 3.5 mg/dL (ref 2.3–6.6)

## 2016-06-18 LAB — LACTATE DEHYDROGENASE: LDH: 151 U/L (ref 98–192)

## 2016-06-18 SURGERY — Surgical Case
Anesthesia: Spinal | Site: Abdomen | Wound class: Clean Contaminated

## 2016-06-18 MED ORDER — HYDRALAZINE HCL 20 MG/ML IJ SOLN: 10.0000 mg | Freq: Once | INTRAMUSCULAR | Status: DC | PRN

## 2016-06-18 MED ORDER — ONDANSETRON HCL 4 MG/2ML IJ SOLN
INTRAMUSCULAR | Status: DC | PRN
  Administered 2016-06-18: 4 mg via INTRAVENOUS

## 2016-06-18 MED ORDER — MAGNESIUM SULFATE BOLUS VIA INFUSION
4.0000 g | Freq: Once | INTRAVENOUS | Status: AC
Start: 2016-06-18 — End: 2016-06-18
  Administered 2016-06-18: 4 g via INTRAVENOUS
  Filled 2016-06-18: qty 500

## 2016-06-18 MED ORDER — SODIUM CHLORIDE 0.9 % IR SOLN
Status: DC | PRN
  Administered 2016-06-18: 1000 mL

## 2016-06-18 MED ORDER — LACTATED RINGERS IV SOLN: INTRAVENOUS | Status: DC

## 2016-06-18 MED ORDER — MENTHOL 3 MG MT LOZG: 1.0000 | LOZENGE | OROMUCOSAL | Status: DC | PRN

## 2016-06-18 MED ORDER — FENTANYL CITRATE (PF) 100 MCG/2ML IJ SOLN
INTRAMUSCULAR | Status: AC
  Filled 2016-06-18: qty 2

## 2016-06-18 MED ORDER — PROMETHAZINE HCL 25 MG/ML IJ SOLN
INTRAMUSCULAR | Status: AC
  Filled 2016-06-18: qty 1

## 2016-06-18 MED ORDER — BUPIVACAINE IN DEXTROSE 0.75-8.25 % IT SOLN
INTRATHECAL | Status: DC | PRN
  Administered 2016-06-18: 1.6 mL via INTRATHECAL

## 2016-06-18 MED ORDER — SCOPOLAMINE 1 MG/3DAYS TD PT72
MEDICATED_PATCH | TRANSDERMAL | Status: DC | PRN
  Administered 2016-06-18: 1 via TRANSDERMAL

## 2016-06-18 MED ORDER — NALOXONE HCL 2 MG/2ML IJ SOSY: 1.0000 ug/kg/h | PREFILLED_SYRINGE | INTRAVENOUS | Status: DC | PRN

## 2016-06-18 MED ORDER — SODIUM CHLORIDE 0.9% FLUSH: 3.0000 mL | INTRAVENOUS | Status: DC | PRN

## 2016-06-18 MED ORDER — NALBUPHINE HCL 10 MG/ML IJ SOLN: 5.0000 mg | Freq: Once | INTRAMUSCULAR | Status: DC | PRN

## 2016-06-18 MED ORDER — PHENYLEPHRINE 8 MG IN D5W 100 ML (0.08MG/ML) PREMIX OPTIME
INJECTION | INTRAVENOUS | Status: DC | PRN
  Administered 2016-06-18: 60 ug/min via INTRAVENOUS

## 2016-06-18 MED ORDER — SODIUM CHLORIDE 0.9 % IV SOLN
Freq: Once | INTRAVENOUS | Status: AC
  Administered 2016-06-18: 20:00:00 via INTRAVENOUS

## 2016-06-18 MED ORDER — CEFAZOLIN SODIUM-DEXTROSE 2-4 GM/100ML-% IV SOLN
2.0000 g | INTRAVENOUS | Status: AC
  Administered 2016-06-18: 2 g via INTRAVENOUS
  Filled 2016-06-18: qty 100

## 2016-06-18 MED ORDER — DIPHENHYDRAMINE HCL 25 MG PO CAPS
25.0000 mg | ORAL_CAPSULE | ORAL | Status: DC | PRN
  Filled 2016-06-18: qty 1

## 2016-06-18 MED ORDER — MAGNESIUM SULFATE 50 % IJ SOLN
2.0000 g/h | INTRAVENOUS | Status: AC
  Administered 2016-06-19: 2 g/h via INTRAVENOUS
  Filled 2016-06-18 (×2): qty 80

## 2016-06-18 MED ORDER — OXYTOCIN 40 UNITS IN LACTATED RINGERS INFUSION - SIMPLE MED
2.5000 [IU]/h | INTRAVENOUS | Status: AC
  Administered 2016-06-18: 2.5 [IU]/h via INTRAVENOUS
  Filled 2016-06-18: qty 1000

## 2016-06-18 MED ORDER — SIMETHICONE 80 MG PO CHEW: 80.0000 mg | CHEWABLE_TABLET | ORAL | Status: DC | PRN

## 2016-06-18 MED ORDER — NALOXONE HCL 0.4 MG/ML IJ SOLN: 0.4000 mg | INTRAMUSCULAR | Status: DC | PRN

## 2016-06-18 MED ORDER — SOD CITRATE-CITRIC ACID 500-334 MG/5ML PO SOLN
30.0000 mL | ORAL | Status: AC
  Administered 2016-06-18: 30 mL via ORAL
  Filled 2016-06-18: qty 15

## 2016-06-18 MED ORDER — HYDROMORPHONE HCL 1 MG/ML IJ SOLN: 0.2500 mg | INTRAMUSCULAR | Status: DC | PRN

## 2016-06-18 MED ORDER — OXYCODONE HCL 5 MG PO TABS
5.0000 mg | ORAL_TABLET | ORAL | Status: DC | PRN
  Administered 2016-06-18 – 2016-06-20 (×5): 5 mg via ORAL
  Filled 2016-06-18 (×5): qty 1

## 2016-06-18 MED ORDER — KETOROLAC TROMETHAMINE 30 MG/ML IJ SOLN
INTRAMUSCULAR | Status: DC | PRN
  Administered 2016-06-18: 30 mg via INTRAVENOUS

## 2016-06-18 MED ORDER — DIPHENHYDRAMINE HCL 50 MG/ML IJ SOLN: 12.5000 mg | INTRAMUSCULAR | Status: DC | PRN

## 2016-06-18 MED ORDER — PHENYLEPHRINE HCL 10 MG/ML IJ SOLN
INTRAMUSCULAR | Status: DC | PRN
  Administered 2016-06-18 (×2): 40 ug via INTRAVENOUS

## 2016-06-18 MED ORDER — SCOPOLAMINE 1 MG/3DAYS TD PT72: 1.0000 | MEDICATED_PATCH | Freq: Once | TRANSDERMAL | Status: DC

## 2016-06-18 MED ORDER — TETANUS-DIPHTH-ACELL PERTUSSIS 5-2.5-18.5 LF-MCG/0.5 IM SUSP: 0.5000 mL | Freq: Once | INTRAMUSCULAR | Status: DC

## 2016-06-18 MED ORDER — ONDANSETRON HCL 4 MG/2ML IJ SOLN: 4.0000 mg | Freq: Three times a day (TID) | INTRAMUSCULAR | Status: DC | PRN

## 2016-06-18 MED ORDER — FAMOTIDINE IN NACL 20-0.9 MG/50ML-% IV SOLN
20.0000 mg | Freq: Once | INTRAVENOUS | Status: AC
  Administered 2016-06-18: 20 mg via INTRAVENOUS
  Filled 2016-06-18: qty 50

## 2016-06-18 MED ORDER — KETOROLAC TROMETHAMINE 30 MG/ML IJ SOLN
INTRAMUSCULAR | Status: AC
  Filled 2016-06-18: qty 1

## 2016-06-18 MED ORDER — NALBUPHINE HCL 10 MG/ML IJ SOLN: 5.0000 mg | INTRAMUSCULAR | Status: DC | PRN

## 2016-06-18 MED ORDER — KETOROLAC TROMETHAMINE 30 MG/ML IJ SOLN: 30.0000 mg | Freq: Four times a day (QID) | INTRAMUSCULAR | Status: AC | PRN

## 2016-06-18 MED ORDER — MISOPROSTOL 200 MCG PO TABS
ORAL_TABLET | ORAL | Status: AC
  Administered 2016-06-18: 1000 ug
  Filled 2016-06-18: qty 5

## 2016-06-18 MED ORDER — SIMETHICONE 80 MG PO CHEW
80.0000 mg | CHEWABLE_TABLET | Freq: Three times a day (TID) | ORAL | Status: DC
  Administered 2016-06-19 – 2016-06-20 (×4): 80 mg via ORAL
  Filled 2016-06-18 (×4): qty 1

## 2016-06-18 MED ORDER — FENTANYL CITRATE (PF) 100 MCG/2ML IJ SOLN
INTRAMUSCULAR | Status: DC | PRN
  Administered 2016-06-18: 10 ug via INTRATHECAL

## 2016-06-18 MED ORDER — LACTATED RINGERS IV SOLN
INTRAVENOUS | Status: DC | PRN
  Administered 2016-06-18: 17:00:00 via INTRAVENOUS

## 2016-06-18 MED ORDER — NALBUPHINE HCL 10 MG/ML IJ SOLN
INTRAMUSCULAR | Status: AC
  Filled 2016-06-18: qty 1

## 2016-06-18 MED ORDER — NALBUPHINE HCL 10 MG/ML IJ SOLN
5.0000 mg | INTRAMUSCULAR | Status: DC | PRN
  Administered 2016-06-18: 5 mg via INTRAVENOUS

## 2016-06-18 MED ORDER — LABETALOL HCL 5 MG/ML IV SOLN: 20.0000 mg | INTRAVENOUS | Status: DC | PRN

## 2016-06-18 MED ORDER — DIBUCAINE 1 % RE OINT
1.0000 "application " | TOPICAL_OINTMENT | RECTAL | Status: DC | PRN
  Filled 2016-06-18: qty 28

## 2016-06-18 MED ORDER — PROMETHAZINE HCL 25 MG/ML IJ SOLN
6.2500 mg | INTRAMUSCULAR | Status: DC | PRN
  Administered 2016-06-18: 12.5 mg via INTRAVENOUS

## 2016-06-18 MED ORDER — WITCH HAZEL-GLYCERIN EX PADS: 1.0000 "application " | MEDICATED_PAD | CUTANEOUS | Status: DC | PRN

## 2016-06-18 MED ORDER — ZOLPIDEM TARTRATE 5 MG PO TABS: 5.0000 mg | ORAL_TABLET | Freq: Every evening | ORAL | Status: DC | PRN

## 2016-06-18 MED ORDER — MEPERIDINE HCL 25 MG/ML IJ SOLN: 6.2500 mg | INTRAMUSCULAR | Status: DC | PRN

## 2016-06-18 MED ORDER — LACTATED RINGERS IV SOLN
INTRAVENOUS | Status: DC
  Administered 2016-06-18: 15:00:00 via INTRAVENOUS

## 2016-06-18 MED ORDER — OXYCODONE HCL 5 MG PO TABS
10.0000 mg | ORAL_TABLET | ORAL | Status: DC | PRN
  Filled 2016-06-18: qty 2

## 2016-06-18 MED ORDER — MAGNESIUM SULFATE 50 % IJ SOLN
2.0000 g/h | INTRAVENOUS | Status: DC
  Administered 2016-06-18: 2 g/h via INTRAVENOUS
  Filled 2016-06-18: qty 80

## 2016-06-18 MED ORDER — PHENYLEPHRINE 8 MG IN D5W 100 ML (0.08MG/ML) PREMIX OPTIME
INJECTION | INTRAVENOUS | Status: AC
  Filled 2016-06-18: qty 200

## 2016-06-18 MED ORDER — OXYTOCIN 10 UNIT/ML IJ SOLN
INTRAMUSCULAR | Status: AC
  Filled 2016-06-18: qty 4

## 2016-06-18 MED ORDER — ONDANSETRON HCL 4 MG/2ML IJ SOLN
INTRAMUSCULAR | Status: AC
  Filled 2016-06-18: qty 2

## 2016-06-18 MED ORDER — MORPHINE SULFATE (PF) 0.5 MG/ML IJ SOLN
INTRAMUSCULAR | Status: DC | PRN
  Administered 2016-06-18: .2 mg via INTRATHECAL

## 2016-06-18 MED ORDER — COCONUT OIL OIL
1.0000 "application " | TOPICAL_OIL | Status: DC | PRN
  Administered 2016-06-18: 1 via TOPICAL
  Filled 2016-06-18: qty 120

## 2016-06-18 MED ORDER — SCOPOLAMINE 1 MG/3DAYS TD PT72
MEDICATED_PATCH | TRANSDERMAL | Status: AC
  Filled 2016-06-18: qty 1

## 2016-06-18 MED ORDER — SENNOSIDES-DOCUSATE SODIUM 8.6-50 MG PO TABS
2.0000 | ORAL_TABLET | ORAL | Status: DC
  Administered 2016-06-19 (×2): 2 via ORAL
  Filled 2016-06-18 (×2): qty 2

## 2016-06-18 MED ORDER — IBUPROFEN 600 MG PO TABS
600.0000 mg | ORAL_TABLET | Freq: Four times a day (QID) | ORAL | Status: DC
  Administered 2016-06-19 – 2016-06-20 (×6): 600 mg via ORAL
  Filled 2016-06-18 (×6): qty 1

## 2016-06-18 MED ORDER — MISOPROSTOL 200 MCG PO TABS: 1000.0000 ug | ORAL_TABLET | Freq: Once | ORAL | Status: AC

## 2016-06-18 MED ORDER — OXYTOCIN 10 UNIT/ML IJ SOLN
INTRAVENOUS | Status: DC | PRN
  Administered 2016-06-18: 40 [IU] via INTRAVENOUS

## 2016-06-18 MED ORDER — PHENYLEPHRINE 8 MG IN D5W 100 ML (0.08MG/ML) PREMIX OPTIME
INJECTION | INTRAVENOUS | Status: AC
  Filled 2016-06-18: qty 100

## 2016-06-18 MED ORDER — ACETAMINOPHEN 325 MG PO TABS: 650.0000 mg | ORAL_TABLET | ORAL | Status: DC | PRN

## 2016-06-18 MED ORDER — SIMETHICONE 80 MG PO CHEW
80.0000 mg | CHEWABLE_TABLET | ORAL | Status: DC
  Administered 2016-06-19 (×2): 80 mg via ORAL
  Filled 2016-06-18 (×2): qty 1

## 2016-06-18 MED ORDER — PRENATAL MULTIVITAMIN CH
1.0000 | ORAL_TABLET | Freq: Every day | ORAL | Status: DC
  Administered 2016-06-19: 1 via ORAL
  Filled 2016-06-18: qty 1

## 2016-06-18 MED ORDER — DIPHENHYDRAMINE HCL 25 MG PO CAPS
25.0000 mg | ORAL_CAPSULE | Freq: Four times a day (QID) | ORAL | Status: DC | PRN
  Administered 2016-06-19 (×2): 25 mg via ORAL
  Filled 2016-06-18 (×2): qty 1

## 2016-06-18 MED ORDER — MORPHINE SULFATE (PF) 0.5 MG/ML IJ SOLN
INTRAMUSCULAR | Status: AC
  Filled 2016-06-18: qty 10

## 2016-06-18 SURGICAL SUPPLY — 39 items
BENZOIN TINCTURE PRP APPL 2/3 (GAUZE/BANDAGES/DRESSINGS) ×3 IMPLANT
CHLORAPREP W/TINT 26ML (MISCELLANEOUS) ×3 IMPLANT
CLAMP CORD UMBIL (MISCELLANEOUS) ×3 IMPLANT
CLOSURE WOUND 1/2 X4 (GAUZE/BANDAGES/DRESSINGS) ×1
CLOTH BEACON ORANGE TIMEOUT ST (SAFETY) ×3 IMPLANT
DRAPE C SECTION CLR SCREEN (DRAPES) ×3 IMPLANT
DRSG OPSITE POSTOP 4X10 (GAUZE/BANDAGES/DRESSINGS) ×3 IMPLANT
ELECT REM PT RETURN 9FT ADLT (ELECTROSURGICAL) ×3
ELECTRODE REM PT RTRN 9FT ADLT (ELECTROSURGICAL) ×1 IMPLANT
GLOVE BIO SURGEON STRL SZ7.5 (GLOVE) ×3 IMPLANT
GLOVE BIOGEL PI IND STRL 7.0 (GLOVE) ×1 IMPLANT
GLOVE BIOGEL PI INDICATOR 7.0 (GLOVE) ×2
GOWN STRL REUS W/TWL 2XL LVL3 (GOWN DISPOSABLE) ×3 IMPLANT
GOWN STRL REUS W/TWL LRG LVL3 (GOWN DISPOSABLE) ×6 IMPLANT
NEEDLE HYPO 22GX1.5 SAFETY (NEEDLE) ×3 IMPLANT
NS IRRIG 1000ML POUR BTL (IV SOLUTION) ×3 IMPLANT
PACK C SECTION WH (CUSTOM PROCEDURE TRAY) ×3 IMPLANT
PAD ABD 8X7 1/2 STERILE (GAUZE/BANDAGES/DRESSINGS) ×3 IMPLANT
PAD OB MATERNITY 4.3X12.25 (PERSONAL CARE ITEMS) ×3 IMPLANT
PENCIL SMOKE EVAC W/HOLSTER (ELECTROSURGICAL) ×3 IMPLANT
RTRCTR C-SECT PINK 25CM LRG (MISCELLANEOUS) ×3 IMPLANT
SPONGE GAUZE 4X4 12PLY STER LF (GAUZE/BANDAGES/DRESSINGS) ×6 IMPLANT
STRIP CLOSURE SKIN 1/2X4 (GAUZE/BANDAGES/DRESSINGS) ×2 IMPLANT
SUT CHROMIC 1 CTX 36 (SUTURE) ×9 IMPLANT
SUT VIC AB 1 CT1 27 (SUTURE) ×4
SUT VIC AB 1 CT1 27XBRD ANTBC (SUTURE) ×2 IMPLANT
SUT VIC AB 2-0 CT1 (SUTURE) ×3 IMPLANT
SUT VIC AB 2-0 CT1 27 (SUTURE) ×2
SUT VIC AB 2-0 CT1 TAPERPNT 27 (SUTURE) ×1 IMPLANT
SUT VIC AB 3-0 CT1 27 (SUTURE) ×4
SUT VIC AB 3-0 CT1 TAPERPNT 27 (SUTURE) ×2 IMPLANT
SUT VIC AB 3-0 SH 27 (SUTURE)
SUT VIC AB 3-0 SH 27X BRD (SUTURE) IMPLANT
SUT VIC AB 4-0 KS 27 (SUTURE) ×3 IMPLANT
SYR BULB IRRIGATION 50ML (SYRINGE) ×3 IMPLANT
SYR CONTROL 10ML LL (SYRINGE) ×3 IMPLANT
TAPE CLOTH SURG 4X10 WHT LF (GAUZE/BANDAGES/DRESSINGS) ×3 IMPLANT
TOWEL OR 17X24 6PK STRL BLUE (TOWEL DISPOSABLE) ×3 IMPLANT
TRAY FOLEY CATH SILVER 14FR (SET/KITS/TRAYS/PACK) ×3 IMPLANT

## 2016-06-18 NOTE — Lactation Note (Signed)
This note was copied from a baby's chart. Lactation Consultation Note  Patient Name: Ashley Curtis ZOXWR'UToday's Date: 06/18/2016 Reason for consult: Initial assessment Baby at 1 hr of life. Experienced bf mom. Baby latched well, mom denies breast or nipple pain. She voiced no concerns. Discussed baby behavior, feeding frequency, baby belly size, voids, wt loss, breast changes, and nipple care. Demonstrated manual expression, colostrum noted bilaterally. Did not give lactation handouts but did talk about lactation services and support group.     Maternal Data Has patient been taught Hand Expression?: Yes Does the patient have breastfeeding experience prior to this delivery?: Yes  Feeding Feeding Type: Breast Fed  LATCH Score/Interventions Latch: Grasps breast easily, tongue down, lips flanged, rhythmical sucking.  Audible Swallowing: A few with stimulation Intervention(s): Skin to skin;Hand expression  Type of Nipple: Everted at rest and after stimulation  Comfort (Breast/Nipple): Soft / non-tender     Hold (Positioning): Full assist, staff holds infant at breast Intervention(s): Position options;Support Pillows  LATCH Score: 7  Lactation Tools Discussed/Used     Consult Status Consult Status: Follow-up Date: 06/19/16 Follow-up type: In-patient    Rulon Eisenmengerlizabeth E Ebony Rickel 06/18/2016, 6:44 PM

## 2016-06-18 NOTE — Anesthesia Preprocedure Evaluation (Signed)
Anesthesia Evaluation  Patient identified by MRN, date of birth, ID band Patient awake    Reviewed: Allergy & Precautions, H&P , NPO status , Patient's Chart, lab work & pertinent test results  Airway Mallampati: II  TM Distance: >3 FB Neck ROM: Full    Dental no notable dental hx. (+) Dental Advisory Given   Pulmonary neg pulmonary ROS, former smoker,    Pulmonary exam normal breath sounds clear to auscultation       Cardiovascular hypertension, Normal cardiovascular exam Rhythm:Regular Rate:Normal     Neuro/Psych PSYCHIATRIC DISORDERS Anxiety negative neurological ROS     GI/Hepatic negative GI ROS, Neg liver ROS,   Endo/Other    Renal/GU negative Renal ROS  negative genitourinary   Musculoskeletal negative musculoskeletal ROS (+)   Abdominal   Peds negative pediatric ROS (+)  Hematology negative hematology ROS (+)   Anesthesia Other Findings   Reproductive/Obstetrics negative OB ROS (+) Pregnancy                             Anesthesia Physical  Anesthesia Plan  ASA: III and emergent  Anesthesia Plan: Spinal   Post-op Pain Management:    Induction:   Airway Management Planned: Natural Airway  Additional Equipment:   Intra-op Plan:   Post-operative Plan:   Informed Consent: I have reviewed the patients History and Physical, chart, labs and discussed the procedure including the risks, benefits and alternatives for the proposed anesthesia with the patient or authorized representative who has indicated his/her understanding and acceptance.   Dental advisory given  Plan Discussed with: CRNA, Surgeon and Anesthesiologist  Anesthesia Plan Comments:         Anesthesia Quick Evaluation

## 2016-06-18 NOTE — Consult Note (Signed)
Neonatology Note:   Attendance at C-section:    I was asked by Dr. Ervin to attend this repeat C/S at term due to pre-eclampsia. The mother is a G2P1 B pos, GBS positive with GDM in previous pregnancy and abnormal 1 hour, but normal 3-hour GTT in this pregnancy. ROM at delivery, fluid clear. Infant vigorous with good spontaneous cry and tone. Delayed cord clamping was done. Needed bulb suctioning several times for clear secretions. Ap 9/9. Lungs clear to ausc in DR. To CN to care of Pediatrician.   Ashley Curtis C. Suki Crockett, MD 

## 2016-06-18 NOTE — MAU Provider Note (Signed)
History     CSN: [id number]  Arrival date and time: 06/18/16 1347   First Provider Initiated Contact with Patient 06/18/16 1430      Chief Complaint  Patient presents with  . Hypertension  . Nausea  . Dizziness   HPI [name] is a 31 y.o. G2P1001 at [redacted]w[redacted]d who presents to MAU today with complaint of elevated blood pressure, headache and dizziness. She states that this has been going on for a while. BP in the office has been elevated slightly at last visit. She was told to check BP at home after being seen in the office yesterday. She states at 1030 today BP was 154/93 and at 1255 it was 153/93 at home. She has not taken any pain medication. She states occasional floaters and dizziness, but denies blurred vision, RUQ abdominal pain, vaginal bleeding or LOF. She has had some mild edema of the hands and feet and contractions q 5-10 minutes, mild. She reports good fetal movement.   OB History    Gravida Para Term Preterm AB Living   2 1 1  0 0 1   SAB TAB Ectopic Multiple Live Births   0 0 0 0 1      Past Medical History:  Diagnosis Date  . Anemia   . Anxiety   . Gestational diabetes   . Migraine     Past Surgical History:  Procedure Laterality Date  . APPENDECTOMY     6 yoa  . CESAREAN SECTION N/A 12/04/2012   Procedure: Primary CESAREAN SECTION of baby girl at 2143 APGAR 9/9;  Surgeon: Esmeralda Arthur, MD;  Location: [facility];  Service: Obstetrics;  Laterality: N/A;    Family History  Problem Relation Age of Onset  . Gestational diabetes Mother   . Seizures Brother     Social History  Substance Use Topics  . Smoking status: Former Smoker    Packs/day: 1.00    Types: Cigarettes    Quit date: 10/28/2015  . Smokeless tobacco: Never Used  . Alcohol use No    Allergies: No Known Allergies  Prescriptions Prior to Admission  Medication Sig Dispense Refill Last Dose  . Prenatal Vit-Fe Fumarate-FA (PRENATAL COMPLETE) 14-0.4 MG TABS Take 1 tablet by mouth daily.  60 each 0 Taking    Review of Systems  Constitutional: Negative for fever.  Eyes: Negative for visual disturbance.  Cardiovascular: Positive for leg swelling.  Gastrointestinal: Negative for abdominal pain, constipation, diarrhea, nausea and vomiting.  Genitourinary: Negative for vaginal bleeding and vaginal discharge.  Neurological: Positive for dizziness and headaches.   Physical Exam   Blood pressure 149/90, pulse 87, temperature 98 F (36.7 C), resp. rate 18, height 5\' 5"  (1.651 m), weight 190 lb 1.6 oz (86.2 kg), last menstrual period 09/13/2015, currently breastfeeding.  Physical Exam  Nursing note and vitals reviewed. Constitutional: She is oriented to person, place, and time. She appears well-developed and well-nourished. No distress.  HENT:  Head: Normocephalic and atraumatic.  Cardiovascular: Normal rate.   Respiratory: Effort normal.  GI: Soft. She exhibits no distension and no mass. There is no tenderness. There is no rebound and no guarding.  Musculoskeletal: She exhibits edema (mild non-pitting edema).  Neurological: She is alert and oriented to person, place, and time. She has normal reflexes.  No clonus  Skin: Skin is warm and dry. No erythema.  Psychiatric: She has a normal mood and affect.    Results for orders placed or performed during the hospital encounter  of 06/18/16 (from the past 24 hour(s))  Urinalysis, Routine w reflex microscopic     Status: Abnormal   Collection Time: 06/18/16  2:10 PM  Result Value Ref Range   Color, Urine YELLOW YELLOW   APPearance HAZY (A) CLEAR   Specific Gravity, Urine 1.016 1.005 - 1.030   pH 7.0 5.0 - 8.0   Glucose, UA NEGATIVE NEGATIVE mg/dL   Hgb urine dipstick NEGATIVE NEGATIVE   Bilirubin Urine NEGATIVE NEGATIVE   Ketones, ur 5 (A) NEGATIVE mg/dL   Protein, ur 30 (A) NEGATIVE mg/dL   Nitrite NEGATIVE NEGATIVE   Leukocytes, UA LARGE (A) NEGATIVE   RBC / HPF 0-5 0 - 5 RBC/hpf   WBC, UA 0-5 0 - 5 WBC/hpf   Bacteria,  UA RARE (A) NONE SEEN   Squamous Epithelial / LPF 0-5 (A) NONE SEEN   Mucous PRESENT   CBC with Differential/Platelet     Status: Abnormal   Collection Time: 06/18/16  2:28 PM  Result Value Ref Range   WBC 7.2 4.0 - 10.5 K/uL   RBC 3.26 (L) 3.87 - 5.11 MIL/uL   Hemoglobin 7.3 (L) 12.0 - 15.0 g/dL   HCT 81.123.4 (L) 91.436.0 - 78.246.0 %   MCV 71.8 (L) 78.0 - 100.0 fL   MCH 22.4 (L) 26.0 - 34.0 pg   MCHC 31.2 30.0 - 36.0 g/dL   RDW 95.616.2 (H) 21.311.5 - 08.615.5 %   Platelets 234 150 - 400 K/uL   Neutrophils Relative % 69 %   Neutro Abs 5.0 1.7 - 7.7 K/uL   Lymphocytes Relative 27 %   Lymphs Abs 1.9 0.7 - 4.0 K/uL   Monocytes Relative 3 %   Monocytes Absolute 0.2 0.1 - 1.0 K/uL   Eosinophils Relative 1 %   Eosinophils Absolute 0.1 0.0 - 0.7 K/uL   Basophils Relative 0 %   Basophils Absolute 0.0 0.0 - 0.1 K/uL   Patient Vitals for the past 24 hrs:  BP Temp Pulse Resp Height Weight  06/18/16 1442 151/100 - 77 - - -  06/18/16 1432 144/96 - 88 - - -  06/18/16 1422 146/90 - 77 - - -  06/18/16 1410 149/90 98 F (36.7 C) 87 18 5\' 5"  (1.651 m) 190 lb 1.6 oz (86.2 kg)    MAU Course  Procedures None  MDM UA, CBC, CMP, Urine protein/Creatinine ratio today  Serial BPs All values are elevated, but none in severe range today.   Assessment and Plan  A: SIUP at 3860w3d Pre-eclampsia, third trimester Previous C/S   P:  Patient to OR with Dr. Johnston EbbsErvin/Dr. Delight HohSchenk  Marsel Gail N Thera Basden, PA-C  06/18/2016, 2:30 PM

## 2016-06-18 NOTE — Op Note (Signed)
Cesarean Section Procedure Note  06/18/2016  6:21 PM  PATIENT:  Ashley Curtis  31 y.o. female  PRE-OPERATIVE DIAGNOSIS:  previous cesarean section  POST-OPERATIVE DIAGNOSIS:  previous cesarean section  PROCEDURE:  Procedure(s): CESAREAN SECTION (N/A)  SURGEON:  Surgeon(s) and Role:    * Hermina StaggersMichael L Ervin, MD - Primary    * Ashley SkeensNicholas Michael Schenk, MD - Fellow  ASSISTANTS: none   ANESTHESIA:   spinal  EBL:  Total I/O In: 1000 [I.V.:1000] Out: 800 [Urine:200; Blood:600]  BLOOD ADMINISTERED:none  DRAINS: none   LOCAL MEDICATIONS USED:  NONE  SPECIMEN:  Source of Specimen:  placenta  DISPOSITION OF SPECIMEN:  PATHOLOGY   Procedure Details   The patient was seen in the Holding Room. The risks, benefits, complications, treatment options, and expected outcomes were discussed with the patient.  The patient concurred with the proposed plan, giving informed consent.  The site of surgery properly noted/marked. The patient was taken to Operating Room # 9, identified as Ashley Curtis and the procedure verified as C-Section Delivery. A Time Out was held and the above information confirmed.  After induction of anesthesia, the patient was draped and prepped in the usual sterile manner. A Pfannenstiel incision was made and carried down through the subcutaneous tissue to the fascia. Fascial incision was made and extended transversely. The fascia was separated from the underlying rectus tissue superiorly and inferiorly. The peritoneum was identified and entered. Peritoneal incision was extended longitudinally. The utero-vesical peritoneal reflection was incised transversely and the bladder flap was bluntly freed from the lower uterine segment. A low transverse uterine incision was made. Delivered from cephalic presentation was a  Female with Apgar scores of 9 at one minute and 9 at five minutes. After the umbilical cord was clamped and cut cord blood was obtained for evaluation. The placenta was  removed intact and appeared normal. The uterine outline, tubes and ovaries appeared normal. The uterine incision was closed with running locked sutures of 0 chromic. And imbricating layer of the same suture was completed. Hemostasis was observed. Lavage was carried out until clear. The fascia was then reapproximated with running sutures of 1 vicryl. Subcutaneous tissue was closed with a running suture of 3-0 vicryl. The skin was reapproximated with 3.0 and Vicryl.  Instrument, sponge, and needle counts were correct prior the abdominal closure and at the conclusion of the case.   Complications:  None; patient tolerated the procedure well.  COUNTS:  YES  PLAN OF CARE: Admit to inpatient   PATIENT DISPOSITION:  PACU - hemodynamically stable.   Delay start of Pharmacological VTE agent (>24hrs) due to surgical blood loss or risk of bleeding: yes             Disposition: PACU - hemodynamically stable.         Condition: stable   Ashley SkeensNicholas Michael Schenk, MD 06/18/2016 6:21 PM

## 2016-06-18 NOTE — Transfer of Care (Signed)
Immediate Anesthesia Transfer of Care Note  Patient: Ashley BasemanBarbara F Curtis  Procedure(s) Performed: Procedure(s): CESAREAN SECTION (N/A)  Patient Location: PACU  Anesthesia Type:Spinal  Level of Consciousness: awake, alert  and oriented  Airway & Oxygen Therapy: Patient Spontanous Breathing  Post-op Assessment: Report given to RN and Post -op Vital signs reviewed and stable  Post vital signs: Reviewed and stable BP 123/91, HR 69, RR 16, Sao2 98%  Last Vitals:  Vitals:   06/18/16 1622 06/18/16 1632  BP: 137/90 129/84  Pulse: 89 88  Resp:    Temp:      Last Pain:  Vitals:   06/18/16 1407  PainSc: 4          Complications: No apparent anesthesia complications

## 2016-06-18 NOTE — Telephone Encounter (Signed)
Pt called and stated that she had elevated bp in the office yesterday and was told to keep a check on her blood pressures. This morning at 6:30 am her reading was 131/90 and at 1030 it was 154/93. Pt reports having a headache, blurry vision and dizziness. She is also having contractions that are painful and becoming more frequent. I advised her to go to MAU for evaluation. Patient is agreeable to this and has someone that can drive her to the MAU.

## 2016-06-18 NOTE — MAU Note (Signed)
Pt b/p have been elevated and the office told her to monitor it. Today 150's/90's c/o dizziness, H/A and nausea. Having some mild ctx as well.

## 2016-06-18 NOTE — Anesthesia Postprocedure Evaluation (Signed)
Anesthesia Post Note  Patient: Ashley Curtis  Procedure(s) Performed: Procedure(s) (LRB): CESAREAN SECTION (N/A)  Patient location during evaluation: PACU Anesthesia Type: Spinal Level of consciousness: awake and alert Pain management: pain level controlled Vital Signs Assessment: post-procedure vital signs reviewed and stable Respiratory status: spontaneous breathing and respiratory function stable Cardiovascular status: blood pressure returned to baseline and stable Postop Assessment: spinal receding Anesthetic complications: no       Last Vitals:  Vitals:   06/18/16 1845 06/18/16 1929  BP: 137/78 139/89  Pulse: 73 70  Resp: 18 18  Temp:  36.6 C    Last Pain:  Vitals:   06/18/16 1830  TempSrc:   PainSc: 0-No pain                 Orris Perin DANIEL

## 2016-06-18 NOTE — Progress Notes (Signed)
OB Attending  CTSP d/t vaginal bleeding  Pt approx 5 hr out from c section secondary to repeat and severe PEC. Known anemia prior to procedure (hgb 7.3) Receiving first unit of 2 units.   Pt reports getting up to go to rest room and starting passing several large blood clots and bleeding. Had to change pad several times. Approx 200-400 cc blood loss. Pt reports uterus had only been message once.  Uterus message until firm, drsg intact with min blood noted, bimanual exam confirmed uterus firm and min clots. Min blood noted on pad.  Pt given oral Cytotec.    Pt observed and reexamined with uterus remaining firm and min vaginal bleeding.  Continue with post op care and blood transfusion. Events of vaginal bleeding reviewed and discussed with pt.

## 2016-06-18 NOTE — H&P (Signed)
Obstetric Preoperative History and Physical  Ashley Curtis is a 31 y.o. G2P1001 with IUP at 74w3dpresenting for headache and elevated BP. She has had no other problems with this pregnancy. She reports occasionall floaters in her vision and has had severe range BP in MAU. She has previous c-section in 2014.    Prenatal Course Source of Care: [facility]  with onset of care at 11 weeks 3 days Pregnancy complications or risks: Patient Active Problem List   Diagnosis Date Noted  . GBS (group B Streptococcus carrier), +RV culture, currently pregnant 06/04/2016  . Previous gestational diabetes mellitus, antepartum 12/16/2015  . Normal pregnancy in multigravida, antepartum 12/12/2015  . Previous cesarean section complicating pregnancy, antepartum condition or complication 025/09/3974 . Anxiety   . Migraine    She plans to breastfeed She desires nexplanon for postpartum contraception.   Prenatal labs and studies: ABO, Rh: B/POS/-- (07/20 1123) Antibody: NEG (07/20 1123) Rubella: 6.14 (07/20 1123) RPR: NON REAC (07/20 1123)  HBsAg: NEGATIVE (07/20 1123)  HIV: NONREACTIVE (07/20 1123)  GBHA:LPFXTKWI(01/03 0000) 1 hr Glucola  Abnormal. 3 hr normal in first trimester Genetic screening normal Anatomy UKoreanormal  Prenatal Transfer Tool  Maternal Diabetes: No Genetic Screening: Normal Maternal Ultrasounds/Referrals: Normal Fetal Ultrasounds or other Referrals:  None Maternal Substance Abuse:  No Significant Maternal Medications:  None Significant Maternal Lab Results: Lab values include: Group B Strep positive  Past Medical History:  Diagnosis Date  . Anemia   . Anxiety   . Gestational diabetes   . Migraine     Past Surgical History:  Procedure Laterality Date  . APPENDECTOMY     6 yoa  . CESAREAN SECTION N/A 12/04/2012   Procedure: Primary CESAREAN SECTION of baby girl at 2143 APGAR 9/9;  Surgeon: SAlwyn Pea MD;  [facility];  Service: Obstetrics;  Laterality: N/A;    OB  History  Gravida Para Term Preterm AB Living  2 1 1  0 0 1  SAB TAB Ectopic Multiple Live Births  0 0 0 0 1    # Outcome Date GA Lbr Len/2nd Weight Sex Delivery Anes PTL Lv  2 Current           1 Term 12/04/12 435w0d7 lb 4 oz (3.289 kg) F CS-LTranv Spinal  LIV     Complications: Gestational diabetes     Birth Comments: c-section due to GDM      Social History   Social History  . Marital status: Married    Spouse name: RyPayson Crumby. Number of children: 0  . Years of education: 12.5   Occupational History  . Receptionist    Social History Main Topics  . Smoking status: Former Smoker    Packs/day: 1.00    Types: Cigarettes    Quit date: 10/28/2015  . Smokeless tobacco: Never Used  . Alcohol use No  . Drug use: No  . Sexual activity: Yes    Partners: Male     Comment: Pills in the past   Other Topics Concern  . None   Social History Narrative  . None    Family History  Problem Relation Age of Onset  . Gestational diabetes Mother   . Seizures Brother     Prescriptions Prior to Admission  Medication Sig Dispense Refill Last Dose  . calcium carbonate (TUMS - DOSED IN MG ELEMENTAL CALCIUM) 500 MG chewable tablet Chew 2 tablets by mouth at bedtime as needed.  06/17/2016 at Unknown time  . Prenatal Vit-Fe Fumarate-FA (PRENATAL COMPLETE) 14-0.4 MG TABS Take 1 tablet by mouth daily. 60 each 0 06/17/2016 at Unknown time    No Known Allergies  Review of Systems: Negative except for what is mentioned in HPI.  Physical Exam: BP 151/100   Pulse 77   Temp 98 F (36.7 C)   Resp 18   Ht 5' 5"  (1.651 m)   Wt 190 lb 1.6 oz (86.2 kg)   LMP 09/13/2015   BMI 31.63 kg/m  FHR by Doppler: 150 bpm CONSTITUTIONAL: Well-developed, well-nourished female in no acute distress.  HENT:  Normocephalic, atraumatic, External right and left ear normal. Oropharynx is clear and moist EYES: Conjunctivae and EOM are normal. Pupils are equal, round, and reactive to light. No scleral icterus.   NECK: Normal range of motion, supple, no masses SKIN: Skin is warm and dry. No rash noted. Not diaphoretic. No erythema. No pallor. [place]: Alert and oriented to person, place, and time. Normal reflexes, muscle tone coordination. No cranial nerve deficit noted. PSYCHIATRIC: Normal mood and affect. Normal behavior. Normal judgment and thought content. CARDIOVASCULAR: Normal heart rate noted, regular rhythm RESPIRATORY: Effort and breath sounds normal, no problems with respiration noted ABDOMEN: Soft, nontender, nondistended, gravid. Well-healed Pfannenstiel incision. PELVIC: Deferred MUSCULOSKELETAL: Normal range of motion. No edema and no tenderness. 2+ distal pulses.   Pertinent Labs/Studies:   Results for orders placed or performed during the hospital encounter of 06/18/16 (from the past 72 hour(s))  Urinalysis, Routine w reflex microscopic     Status: Abnormal   Collection Time: 06/18/16  2:10 PM  Result Value Ref Range   Color, Urine YELLOW YELLOW   APPearance HAZY (A) CLEAR   Specific Gravity, Urine 1.016 1.005 - 1.030   pH 7.0 5.0 - 8.0   Glucose, UA NEGATIVE NEGATIVE mg/dL   Hgb urine dipstick NEGATIVE NEGATIVE   Bilirubin Urine NEGATIVE NEGATIVE   Ketones, ur 5 (A) NEGATIVE mg/dL   Protein, ur 30 (A) NEGATIVE mg/dL   Nitrite NEGATIVE NEGATIVE   Leukocytes, UA LARGE (A) NEGATIVE   RBC / HPF 0-5 0 - 5 RBC/hpf   WBC, UA 0-5 0 - 5 WBC/hpf   Bacteria, UA RARE (A) NONE SEEN   Squamous Epithelial / LPF 0-5 (A) NONE SEEN   Mucous PRESENT   Protein / creatinine ratio, urine     Status: Abnormal   Collection Time: 06/18/16  2:10 PM  Result Value Ref Range   Creatinine, Urine 150.00 mg/dL   Total Protein, Urine 37 mg/dL    Comment: NO NORMAL RANGE ESTABLISHED FOR THIS TEST   Protein Creatinine Ratio 0.25 (H) 0.00 - 0.15 mg/mg[Cre]  CBC with Differential/Platelet     Status: Abnormal   Collection Time: 06/18/16  2:28 PM  Result Value Ref Range   WBC 7.2 4.0 - 10.5 K/uL    RBC 3.26 (L) 3.87 - 5.11 MIL/uL   Hemoglobin 7.3 (L) 12.0 - 15.0 g/dL   HCT 23.4 (L) 36.0 - 46.0 %   MCV 71.8 (L) 78.0 - 100.0 fL   MCH 22.4 (L) 26.0 - 34.0 pg   MCHC 31.2 30.0 - 36.0 g/dL   RDW 16.2 (H) 11.5 - 15.5 %   Platelets 234 150 - 400 K/uL   Neutrophils Relative % 69 %   Neutro Abs 5.0 1.7 - 7.7 K/uL   Lymphocytes Relative 27 %   Lymphs Abs 1.9 0.7 - 4.0 K/uL   Monocytes Relative 3 %  Monocytes Absolute 0.2 0.1 - 1.0 K/uL   Eosinophils Relative 1 %   Eosinophils Absolute 0.1 0.0 - 0.7 K/uL   Basophils Relative 0 %   Basophils Absolute 0.0 0.0 - 0.1 K/uL  Comprehensive metabolic panel     Status: Abnormal (Preliminary result)   Collection Time: 06/18/16  2:28 PM  Result Value Ref Range   Sodium 134 (L) 135 - 145 mmol/L   Potassium 3.2 (L) 3.5 - 5.1 mmol/L   Chloride 106 101 - 111 mmol/L   CO2 20 (L) 22 - 32 mmol/L   Glucose, Bld 80 65 - 99 mg/dL   BUN PENDING 6 - 20 mg/dL   Creatinine, Ser 0.43 (L) 0.44 - 1.00 mg/dL   Calcium 8.4 (L) 8.9 - 10.3 mg/dL   Total Protein 6.7 6.5 - 8.1 g/dL   Albumin 3.0 (L) 3.5 - 5.0 g/dL   AST 17 15 - 41 U/L   ALT 10 (L) 14 - 54 U/L   Alkaline Phosphatase 136 (H) 38 - 126 U/L   Total Bilirubin 0.3 0.3 - 1.2 mg/dL   GFR calc non Af Amer >60 >60 mL/min   GFR calc Af Amer >60 >60 mL/min    Comment: (NOTE) The eGFR has been calculated using the CKD EPI equation. This calculation has not been validated in all clinical situations. eGFR's persistently <60 mL/min signify possible Chronic Kidney Disease.    Anion gap 8 5 - 15  Uric acid     Status: None   Collection Time: 06/18/16  2:28 PM  Result Value Ref Range   Uric Acid, Serum 3.5 2.3 - 6.6 mg/dL  Lactate dehydrogenase     Status: None   Collection Time: 06/18/16  2:28 PM  Result Value Ref Range   LDH 151 98 - 192 U/L    Assessment and Plan :Ashley Curtis is a 31 y.o. G2P1001 at 33w3dbeing admitted for repeat c-section secondary to pre-eclampsia  Patient has elevated BP in  severe range and headache. Labs are normal.   Will start magnesium 4g bolus with 2g per hour. Start labetalol protocol.   Hemoglobin is 7.3 will cross 2 units.    The risks of cesarean section discussed with the patient included but were not limited to: bleeding which may require transfusion or reoperation; infection which may require antibiotics; injury to bowel, bladder, ureters or other surrounding organs; injury to the fetus; need for additional procedures including hysterectomy in the event of a life-threatening hemorrhage; placental abnormalities wth subsequent pregnancies, incisional problems, thromboembolic phenomenon and other postoperative/anesthesia complications. The patient concurred with the proposed plan, giving informed written consent for the procedure. Patient has been NPO since last night she will remain NPO for procedure. Anesthesia and OR aware. Preoperative prophylactic antibiotics and SCDs ordered on call to the OR. To OR when ready.    NJacquiline DoeMD Ob fellow Faculty Practice, [facility]

## 2016-06-18 NOTE — Anesthesia Procedure Notes (Signed)
Spinal  Patient location during procedure: OR Start time: 06/18/2016 4:54 PM End time: 06/18/2016 5:03 PM Staffing Anesthesiologist: Heather RobertsSINGER, Indria Bishara Performed: anesthesiologist  Preanesthetic Checklist Completed: patient identified, surgical consent, pre-op evaluation, timeout performed, IV checked, risks and benefits discussed and monitors and equipment checked Spinal Block Patient position: sitting Prep: DuraPrep Patient monitoring: cardiac monitor, continuous pulse ox and blood pressure Approach: midline Location: L2-3 Injection technique: single-shot Needle Needle type: Pencan  Needle gauge: 24 G Needle length: 9 cm Additional Notes Functioning IV was confirmed and monitors were applied. Sterile prep and drape, including hand hygiene and sterile gloves were used. The patient was positioned and the spine was prepped. The skin was anesthetized with lidocaine.  Free flow of clear CSF was obtained prior to injecting local anesthetic into the CSF.  The spinal needle aspirated freely following injection.  The needle was carefully withdrawn.  The patient tolerated the procedure well.

## 2016-06-19 ENCOUNTER — Encounter (HOSPITAL_COMMUNITY)
Admission: RE | Admit: 2016-06-19 | Discharge: 2016-06-19 | Disposition: A | Discharge status: Home / Self Care | Payer: Self-pay | Source: Ambulatory Visit

## 2016-06-19 ENCOUNTER — Encounter (HOSPITAL_COMMUNITY): Payer: Self-pay | Admitting: *Deleted

## 2016-06-19 LAB — CBC
HEMATOCRIT: 24.2 % — AB (ref 36.0–46.0)
Hemoglobin: 8.1 g/dL — ABNORMAL LOW (ref 12.0–15.0)
MCH: 24.3 pg — ABNORMAL LOW (ref 26.0–34.0)
MCHC: 33.5 g/dL (ref 30.0–36.0)
MCV: 72.5 fL — ABNORMAL LOW (ref 78.0–100.0)
Platelets: 210 10*3/uL (ref 150–400)
RBC: 3.34 MIL/uL — ABNORMAL LOW (ref 3.87–5.11)
RDW: 18 % — AB (ref 11.5–15.5)
WBC: 13.1 10*3/uL — AB (ref 4.0–10.5)

## 2016-06-19 LAB — TYPE AND SCREEN
BLOOD PRODUCT EXPIRATION DATE: 201802032359
BLOOD PRODUCT EXPIRATION DATE: 201802072359
ISSUE DATE / TIME: 201801252029
ISSUE DATE / TIME: 201801252333
UNIT TYPE AND RH: 5100
UNIT TYPE AND RH: 5100

## 2016-06-19 NOTE — Anesthesia Postprocedure Evaluation (Addendum)
Anesthesia Post Note  Patient: Ashley BasemanBarbara F Curtis  Procedure(s) Performed: Procedure(s) (LRB): CESAREAN SECTION (N/A)  Patient location during evaluation: [facility] Unit Anesthesia Type: Spinal Level of consciousness: awake and alert Pain management: pain level controlled Vital Signs Assessment: post-procedure vital signs reviewed and stable Respiratory status: spontaneous breathing Cardiovascular status: blood pressure returned to baseline Postop Assessment: no headache, no backache and spinal receding Anesthetic complications: no        Last Vitals:  Vitals:   06/19/16 0754 06/19/16 1230  BP: 124/74 122/71  Pulse: 76 77  Resp: 18 16  Temp: 37.1 C 37 C    Last Pain:  Vitals:   06/19/16 1230  TempSrc: Oral  PainSc:    Pain Goal: Patients Stated Pain Goal: 3 (06/19/16 0530)               MARSHALL,BETH

## 2016-06-19 NOTE — Progress Notes (Signed)
PT admitted on Unit at 2000 hour. Pt alert and oriented. Pt on Mag. V/s within normal limits. 2015 MD Notified for  Blood transfusion  Clarification and blood transfusion started. At 2100 pt was assessed, fundus firm , moderate amount of bleeding with few clots. 2200 pt assessed , moderate amount of bleeding with blood oozing with massage.Charge RN Notified and assessed pt and massaged fundus. 2230 pt assisted  to bathroom with blood trickling down her legs. Charge and A/C notified and Md was notified. MD was at bedside to assess pt. 0000 fundus was firm and  peripad changed with moderate bleed with few small clots. 0100 blood trickling down pt legs MD was notified and came to assess pt. Will continue to monitor pt closely.

## 2016-06-19 NOTE — Addendum Note (Signed)
Addendum  created 06/19/16 1344 by Jhonnie GarnerBeth M Cartier Washko, CRNA   Sign clinical note

## 2016-06-19 NOTE — Lactation Note (Signed)
This note was copied from a baby's chart. Lactation Consultation Note  Patient Name: Ashley Curtis ZOXWR'UToday's Date: 06/19/2016  Follow up visit made at 16 hours.  Mom states baby is latching and feeding well.  Reviewed feeding cues, waking techniques and breast massage.  Encouraged to call with concerns/assist.   Maternal Data    Feeding Feeding Type: Breast Fed  LATCH Score/Interventions                      Lactation Tools Discussed/Used     Consult Status      Huston FoleyMOULDEN, Wai Minotti S 06/19/2016, 9:28 AM

## 2016-06-19 NOTE — Progress Notes (Signed)
Subjective: Postpartum Day 1: Cesarean Delivery  Pt without complaints this morning except for some itching. Bleeding is min now. Blood transfusion has completed. Pain controlled. Tolerating diet. Denies HA or visual changes Breast feeding  Objective: Vital signs in last 24 hours: Temp:  [97.5 F (36.4 C)-98.7 F (37.1 C)] 98.7 F (37.1 C) (01/26 0400) Pulse Rate:  [65-133] 77 (01/26 0400) Resp:  [14-20] 20 (01/26 0500) BP: (79-173)/(54-131) 144/83 (01/26 0400) SpO2:  [95 %-100 %] 99 % (01/26 0400) Weight:  [190 lb 1.6 oz (86.2 kg)] 190 lb 1.6 oz (86.2 kg) (01/25 1410)  Physical Exam:  Lungs clear Heart RRR Abd soft + BS U-2 firm  Incision drsg intact GU nl lochia   Recent Labs  06/18/16 1428  HGB 7.3*  HCT 23.4*    Assessment/Plan: POD # 1 RLTCS Severe PEC Anemia  CBC pending. BP stable on magnesium. Will d/c after 24 hrs and monitor Bp. Continue with supportive care  Hermina StaggersMichael L Keng Jewel 06/19/2016, 6:44 AM

## 2016-06-20 LAB — RPR: RPR Ser Ql: NONREACTIVE

## 2016-06-20 MED ORDER — HYDROCHLOROTHIAZIDE 25 MG PO TABS: 25.0000 mg | ORAL_TABLET | Freq: Every day | ORAL | 1 refills | Status: DC

## 2016-06-20 MED ORDER — OXYCODONE-ACETAMINOPHEN 5-325 MG PO TABS
1.0000 | ORAL_TABLET | Freq: Four times a day (QID) | ORAL | 0 refills | Status: DC | PRN
Start: 2016-06-20 — End: 2016-08-20

## 2016-06-20 MED ORDER — HYDROCHLOROTHIAZIDE 25 MG PO TABS
25.0000 mg | ORAL_TABLET | Freq: Every day | ORAL | Status: DC
  Administered 2016-06-20: 25 mg via ORAL
  Filled 2016-06-20: qty 1

## 2016-06-20 MED ORDER — FERROUS SULFATE 325 (65 FE) MG PO TABS: 325.0000 mg | ORAL_TABLET | Freq: Two times a day (BID) | ORAL | 3 refills | Status: DC

## 2016-06-20 MED ORDER — FERROUS SULFATE 325 (65 FE) MG PO TABS
325.0000 mg | ORAL_TABLET | Freq: Three times a day (TID) | ORAL | Status: DC
  Administered 2016-06-20: 325 mg via ORAL
  Filled 2016-06-20: qty 1

## 2016-06-20 MED ORDER — DOCUSATE SODIUM 100 MG PO CAPS: 100.0000 mg | ORAL_CAPSULE | Freq: Two times a day (BID) | ORAL | 2 refills | Status: DC | PRN

## 2016-06-20 NOTE — Progress Notes (Signed)
Patient given discharge instructions and prescription. Reviewed medications and signs/symptoms to return back for. Informed about her follow-up appt. Questions answered. Awaiting pediatrician and discharge for baby.

## 2016-06-20 NOTE — Discharge Instructions (Signed)
Cesarean Delivery, Care After °Refer to this sheet in the next few weeks. These instructions provide you with information about caring for yourself after your procedure. Your health care provider may also give you more specific instructions. Your treatment has been planned according to current medical practices, but problems sometimes occur. Call your health care provider if you have any problems or questions after your procedure. °What can I expect after the procedure? °After the procedure, it is common to have: °· A small amount of blood or clear fluid coming from the incision. °· Some redness, swelling, and pain in your incision area. °· Some abdominal pain and soreness. °· Vaginal bleeding (lochia). °· Pelvic cramps. °· Fatigue. °Follow these instructions at home: °Incision care  ° °· Follow instructions from your health care provider about how to take care of your incision. Make sure you: °¨ Wash your hands with soap and water before you change your bandage (dressing). If soap and water are not available, use hand sanitizer. °¨ Change your dressing as told by your health care provider. °¨ Leave stitches (sutures), skin staples, skin glue, or adhesive strips in place. These skin closures may need to stay in place for 2 weeks or longer. If adhesive strip edges start to loosen and curl up, you may trim the loose edges. Do not remove adhesive strips completely unless your health care provider tells you to do that. °· Check your incision area every day for signs of infection. Check for: °¨ More redness, swelling, or pain. °¨ More fluid or blood. °¨ Warmth. °¨ Pus or a bad smell. °· When you cough or sneeze, hug a pillow. This helps with pain and decreases the chance of your incision opening up (dehiscing). Do this until your incision heals. °Medicines  °· Take over-the-counter and prescription medicines only as told by your health care provider. °· If you were prescribed an antibiotic medicine, take it as told by  your health care provider. Do not stop taking the antibiotic until it is finished. °Driving  °· Do not drive or operate heavy machinery while taking prescription pain medicine. °· Do not drive for 24 hours if you received a sedative. °Lifestyle  °· Do not drink alcohol. This is especially important if you are breastfeeding or taking pain medicine. °· Do not use tobacco products, including cigarettes, chewing tobacco, or e-cigarettes. If you need help quitting, ask your health care provider. Tobacco can delay wound healing. °Eating and drinking  °· Drink at least 8 eight-ounce glasses of water every day unless told not to by your health care provider. If you breastfeed, you may need to drink more water than this. °· Eat high-fiber foods every day. These foods may help prevent or relieve constipation. High-fiber foods include: °¨ Whole grain cereals and breads. °¨ Brown rice. °¨ Beans. °¨ Fresh fruits and vegetables. °Activity  °· Return to your normal activities as told by your health care provider. Ask your health care provider what activities are safe for you. °· Rest as much as possible. Try to rest or take a nap while your baby is sleeping. °· Do not lift anything that is heavier than your baby or 10 lb (4.5 kg) as told by your health care provider. °· Ask your health care provider when you can engage in sexual activity. This may depend on your: °¨ Risk of infection. °¨ Healing rate. °¨ Comfort and desire to engage in sexual activity. °Bathing  °· Do not take baths, swim, or use a   hot tub until your health care provider approves. Ask your health care provider if you can take showers. You may only be allowed to take sponge baths until your incision heals. °· Keep your dressing dry as told by your health care provider. °General instructions  °· Do not use tampons or douches until your health care provider approves. °· Wear: °¨ Loose, comfortable clothing. °¨ A supportive and well-fitting bra. °· Watch for any blood  clots that may pass from your vagina. These may look like clumps of dark red, brown, or black discharge. °· Keep your perineum clean and dry as told by your health care provider. °· Wipe from front to back when you use the toilet. °· If possible, have someone help you care for your baby and help with household activities for a few days after you leave the hospital. °· Keep all follow-up visits for you and your baby as told by your health care provider. This is important. °Contact a health care provider if: °· You have: °¨ Bad-smelling vaginal discharge. °¨ Difficulty urinating. °¨ Pain when urinating. °¨ A sudden increase or decrease in the frequency of your bowel movements. °¨ More redness, swelling, or pain around your incision. °¨ More fluid or blood coming from your incision. °¨ Pus or a bad smell coming from your incision. °¨ A fever. °¨ A rash. °¨ Little or no interest in activities you used to enjoy. °¨ Questions about caring for yourself or your baby. °¨ Nausea. °· Your incision feels warm to the touch. °· Your breasts turn red or become painful or hard. °· You feel unusually sad or worried. °· You vomit. °· You pass large blood clots from your vagina. If you pass a blood clot, save it to show to your health care provider. Do not flush blood clots down the toilet without showing your health care provider. °· You urinate more than usual. °· You are dizzy or light-headed. °· You have not breastfed and have not had a menstrual period for 12 weeks after delivery. °· You stopped breastfeeding and have not had a menstrual period for 12 weeks after stopping breastfeeding. °Get help right away if: °· You have: °¨ Pain that does not go away or get better with medicine. °¨ Chest pain. °¨ Difficulty breathing. °¨ Blurred vision or spots in your vision. °¨ Thoughts about hurting yourself or your baby. °¨ New pain in your abdomen or in one of your legs. °¨ A severe headache. °· You faint. °· You bleed from your vagina so  much that you fill two sanitary pads in one hour. °This information is not intended to replace advice given to you by your health care provider. Make sure you discuss any questions you have with your health care provider. °Document Released: 01/31/2002 Document Revised: 09/19/2015 Document Reviewed: 04/15/2015 °Elsevier Interactive Patient Education © 2017 [employer] ° ° °Preeclampsia and Eclampsia °Preeclampsia is a serious condition that develops only during pregnancy. It is also called toxemia of pregnancy. This condition causes high blood pressure along with other symptoms, such as swelling and headaches. These symptoms may develop as the condition gets worse. Preeclampsia may occur at 20 weeks of pregnancy or later. °Diagnosing and treating preeclampsia early is very important. If not treated early, it can cause serious problems for you and your baby. One problem it can lead to is eclampsia, which is a condition that causes muscle jerking or shaking (convulsions or seizures) in the mother. Delivering your baby is the best   usually go away after your baby is born. What are the causes? The cause of preeclampsia is not known. What increases the risk? The following risk factors make you more likely to develop preeclampsia:  Being pregnant for the first time.  Having had preeclampsia during a past pregnancy.  Having a family history of preeclampsia.  Having high blood pressure.  Being pregnant with twins or triplets.  Being 51 or older.  Being African-American.  Having kidney disease or diabetes.  Having medical conditions such as lupus or blood diseases.  Being very overweight (obese). What are the signs or symptoms? The earliest signs of preeclampsia are:  High blood pressure.  Increased protein in your urine. Your health care provider will check for this at every visit before you give birth (prenatal visit). Other  symptoms that may develop as the condition gets worse include:  Severe headaches.  Sudden weight gain.  Swelling of the hands, face, legs, and feet.  Nausea and vomiting.  Vision problems, such as blurred or double vision.  Numbness in the face, arms, legs, and feet.  Urinating less than usual.  Dizziness.  Slurred speech.  Abdominal pain, especially upper abdominal pain.  Convulsions or seizures. Symptoms generally go away after giving birth. How is this diagnosed? There are no screening tests for preeclampsia. Your health care provider will ask you about symptoms and check for signs of preeclampsia during your prenatal visits. You may also have tests that include:  Urine tests.  Blood tests.  Checking your blood pressure.  Monitoring your babys heart rate.  Ultrasound. How is this treated? You and your health care provider will determine the treatment approach that is best for you. Treatment may include:  Having more frequent prenatal exams to check for signs of preeclampsia, if you have an increased risk for preeclampsia.  Bed rest.  Reducing how much salt (sodium) you eat.  Medicine to lower your blood pressure.  Staying in the hospital, if your condition is severe. There, treatment will focus on controlling your blood pressure and the amount of fluids in your body (fluid retention).  You may need to take medicine (magnesium sulfate) to prevent seizures. This medicine may be given as an injection or through an IV tube.  Delivering your baby early, if your condition gets worse. You may have your labor started with medicine (induced), or you may have a cesarean delivery. Follow these instructions at home: Eating and drinking  Drink enough fluid to keep your urine clear or pale yellow.  Eat a healthy diet that is low in sodium. Do not add salt to your food. Check nutrition labels to see how much sodium a food or beverage contains.  Avoid  caffeine. Lifestyle  Do not use any products that contain nicotine or tobacco, such as cigarettes and e-cigarettes. If you need help quitting, ask your health care provider.  Do not use alcohol or drugs.  Avoid stress as much as possible. Rest and get plenty of sleep. General instructions  Take over-the-counter and prescription medicines only as told by your health care provider.  When lying down, lie on your side. This keeps pressure off of your baby.  When sitting or lying down, raise (elevate) your feet. Try putting some pillows underneath your lower legs.  Exercise regularly. Ask your health care provider what kinds of exercise are best for you.  Keep all follow-up and prenatal visits as told by your health care provider. This is important. How is this prevented? To prevent  preeclampsia or eclampsia from developing during another pregnancy:  Get proper medical care during pregnancy. Your health care provider may be able to prevent preeclampsia or diagnose and treat it early.  Your health care provider may have you take a low-dose aspirin or a calcium supplement during your next pregnancy.  You may have tests of your blood pressure and kidney function after giving birth.  Maintain a healthy weight. Ask your health care provider for help managing weight gain during pregnancy.  Work with your health care provider to manage any long-term (chronic) health conditions you have, such as diabetes or kidney problems. Contact a health care provider if:  You gain more weight than expected.  You have headaches.  You have nausea or vomiting.  You have abdominal pain.  You feel dizzy or light-headed. Get help right away if:  You develop sudden or severe swelling anywhere in your body. This usually happens in the legs.  You gain 5 lbs (2.3 kg) or more during one week.  You have severe:  Abdominal pain.  Headaches.  Dizziness.  Vision problems.  Confusion.  Nausea or  vomiting.  You have a seizure.  You have trouble moving any part of your body.  You develop numbness in any part of your body.  You have trouble speaking.  You have any abnormal bleeding.  You pass out. This information is not intended to replace advice given to you by your health care provider. Make sure you discuss any questions you have with your health care provider. Document Released: 05/08/2000 Document Revised: 01/07/2016 Document Reviewed: 12/16/2015 Elsevier Interactive Patient Education  2017 [employer]

## 2016-06-20 NOTE — Discharge Summary (Signed)
OB Discharge Summary     Patient Name: Ashley BasemanBarbara F Curtis DOB: [birth year] MRN: [id number]  Date of admission: 06/18/2016 Delivering MD: Hermina StaggersERVIN, MICHAEL L   Date of discharge: 06/20/2016  Admitting diagnosis: 39WKS, HIGH BP,DIZZY,NAUSEA Intrauterine pregnancy: 3455w3d     Secondary diagnosis:  Active Problems:   Severe pre-eclampsia  Additional problems: None     Discharge diagnosis: Term Pregnancy Delivered and Preeclampsia (severe)                                                                                                Post partum procedures:Magnesium sulfate  Complications: None  Hospital course:    31 y.o. yo G2P2002 at 4255w3d was admitted to the hospital 06/18/2016 for cesarean section with the following indication:Severe Preeclampsia.  Membrane Rupture Time/Date: 5:20 PM ,06/18/2016   Patient delivered a Viable infant.06/18/2016  Details of operation can be found in separate operative note.  She was on magnesium sulfate for eclampsia prophylaxis until 24 hours postpartum. She was started on HCTZ to help with BP control.   Patient had an otherwise uncomplicated postpartum course.  She is ambulating, tolerating a regular diet, passing flatus, and urinating well. Patient is discharged home in stable condition on  06/20/16         Physical exam  Vitals:   06/19/16 2012 06/20/16 0001 06/20/16 0411 06/20/16 0800  BP: (!) 136/93 (!) 139/97 (!) 142/93 (!) 144/94  Pulse: 81 86  88  Resp: 18 18 18 18   Temp: 98.1 F (36.7 C) 98.6 F (37 C) 97.8 F (36.6 C) 98.2 F (36.8 C)  TempSrc: Oral Oral Oral Oral  SpO2: 100% 99% 99% 100%  Weight:      Height:       General: alert, cooperative and no distress Lochia: appropriate Uterine Fundus: firm Incision: Healing well with no significant drainage, No significant erythema DVT Evaluation: No evidence of DVT seen on physical exam. Negative Homan's sign. Labs: Lab Results  Component Value Date   WBC 13.1 (H) 06/19/2016   HGB 8.1 (L)  06/19/2016   HCT 24.2 (L) 06/19/2016   MCV 72.5 (L) 06/19/2016   PLT 210 06/19/2016   CMP Latest Ref Rng & Units 06/18/2016  Glucose 65 - 99 mg/dL 80  BUN 6 - 20 mg/dL <7(W<5(L)  Creatinine 2.950.44 - 1.00 mg/dL 6.21(H0.43(L)  Sodium 086135 - 578145 mmol/L 134(L)  Potassium 3.5 - 5.1 mmol/L 3.2(L)  Chloride 101 - 111 mmol/L 106  CO2 22 - 32 mmol/L 20(L)  Calcium 8.9 - 10.3 mg/dL 4.6(N8.4(L)  Total Protein 6.5 - 8.1 g/dL 6.7  Total Bilirubin 0.3 - 1.2 mg/dL 0.3  Alkaline Phos 38 - 126 U/L 136(H)  AST 15 - 41 U/L 17  ALT 14 - 54 U/L 10(L)    Discharge instruction: per After Visit Summary and "Baby and Me Booklet".  After visit meds:  Allergies as of 06/20/2016   No Known Allergies     Medication List    TAKE these medications   calcium carbonate 500 MG chewable tablet Commonly known as:  TUMS - dosed in mg elemental  calcium Chew 2 tablets by mouth at bedtime as needed.   docusate sodium 100 MG capsule Commonly known as:  COLACE Take 1 capsule (100 mg total) by mouth 2 (two) times daily as needed.   ferrous sulfate 325 (65 FE) MG tablet Take 1 tablet (325 mg total) by mouth 2 (two) times daily with a meal.   hydrochlorothiazide 25 MG tablet Commonly known as:  HYDRODIURIL Take 1 tablet (25 mg total) by mouth daily.   oxyCODONE-acetaminophen 5-325 MG tablet Commonly known as:  PERCOCET/ROXICET Take 1-2 tablets by mouth every 6 (six) hours as needed.   PRENATAL COMPLETE 14-0.4 MG Tabs Take 1 tablet by mouth daily.       Diet: routine diet  Activity: Advance as tolerated. Pelvic rest for 6 weeks.   Outpatient follow up:4 weeks  Postpartum contraception: Nexplanon  Newborn Data: Live born female  Birth Weight: 7 lb 13 oz (3545 g) APGAR: 9, 9  Baby Feeding: Breast Disposition:home with mother   06/20/2016 Jaynie Collins, MD

## 2016-06-22 ENCOUNTER — Telehealth: Payer: Self-pay | Admitting: *Deleted

## 2016-06-22 ENCOUNTER — Inpatient Hospital Stay (HOSPITAL_COMMUNITY)
Admission: AD | Admit: 2016-06-22 | Discharge: 2016-06-22 | Disposition: A | Discharge status: Home / Self Care | Payer: Medicaid Other | Source: Ambulatory Visit | Attending: Family Medicine | Admitting: Family Medicine

## 2016-06-22 ENCOUNTER — Inpatient Hospital Stay (HOSPITAL_COMMUNITY): Admission: RE | Admit: 2016-06-22 | Payer: Self-pay | Source: Ambulatory Visit | Admitting: Obstetrics & Gynecology

## 2016-06-22 ENCOUNTER — Encounter (HOSPITAL_COMMUNITY): Payer: Self-pay

## 2016-06-22 ENCOUNTER — Encounter (HOSPITAL_COMMUNITY): Admission: RE | Payer: Self-pay | Source: Ambulatory Visit

## 2016-06-22 DIAGNOSIS — Z87891 Personal history of nicotine dependence: Secondary | ICD-10-CM | POA: Diagnosis not present

## 2016-06-22 DIAGNOSIS — R030 Elevated blood-pressure reading, without diagnosis of hypertension: Secondary | ICD-10-CM | POA: Diagnosis not present

## 2016-06-22 DIAGNOSIS — Z79899 Other long term (current) drug therapy: Secondary | ICD-10-CM | POA: Insufficient documentation

## 2016-06-22 DIAGNOSIS — O1415 Severe pre-eclampsia, complicating the puerperium: Secondary | ICD-10-CM | POA: Diagnosis not present

## 2016-06-22 DIAGNOSIS — O1495 Unspecified pre-eclampsia, complicating the puerperium: Secondary | ICD-10-CM | POA: Diagnosis not present

## 2016-06-22 DIAGNOSIS — R109 Unspecified abdominal pain: Secondary | ICD-10-CM | POA: Diagnosis present

## 2016-06-22 LAB — COMPREHENSIVE METABOLIC PANEL
ALT: 32 U/L (ref 14–54)
AST: 32 U/L (ref 15–41)
Albumin: 2.8 g/dL — ABNORMAL LOW (ref 3.5–5.0)
Alkaline Phosphatase: 107 U/L (ref 38–126)
Anion gap: 9 (ref 5–15)
BILIRUBIN TOTAL: 0.5 mg/dL (ref 0.3–1.2)
BUN: 14 mg/dL (ref 6–20)
CHLORIDE: 105 mmol/L (ref 101–111)
CO2: 25 mmol/L (ref 22–32)
Calcium: 8.8 mg/dL — ABNORMAL LOW (ref 8.9–10.3)
Creatinine, Ser: 0.45 mg/dL (ref 0.44–1.00)
Glucose, Bld: 83 mg/dL (ref 65–99)
POTASSIUM: 3.4 mmol/L — AB (ref 3.5–5.1)
Sodium: 139 mmol/L (ref 135–145)
TOTAL PROTEIN: 6.8 g/dL (ref 6.5–8.1)

## 2016-06-22 LAB — CBC
HEMATOCRIT: 24.3 % — AB (ref 36.0–46.0)
Hemoglobin: 7.8 g/dL — ABNORMAL LOW (ref 12.0–15.0)
MCH: 24.2 pg — ABNORMAL LOW (ref 26.0–34.0)
MCHC: 32.1 g/dL (ref 30.0–36.0)
MCV: 75.5 fL — ABNORMAL LOW (ref 78.0–100.0)
PLATELETS: 268 10*3/uL (ref 150–400)
RBC: 3.22 MIL/uL — AB (ref 3.87–5.11)
RDW: 19.6 % — AB (ref 11.5–15.5)
WBC: 9.8 10*3/uL (ref 4.0–10.5)

## 2016-06-22 LAB — PROTEIN / CREATININE RATIO, URINE: Creatinine, Urine: 20 mg/dL

## 2016-06-22 SURGERY — Surgical Case
Anesthesia: Regional | Site: Abdomen

## 2016-06-22 MED ORDER — LABETALOL HCL 5 MG/ML IV SOLN
20.0000 mg | INTRAVENOUS | Status: DC | PRN
Start: 2016-06-22 — End: 2016-06-23

## 2016-06-22 MED ORDER — LABETALOL HCL 5 MG/ML IV SOLN: 20.0000 mg | INTRAVENOUS | Status: DC | PRN

## 2016-06-22 MED ORDER — AMLODIPINE BESYLATE 10 MG PO TABS: 10.0000 mg | ORAL_TABLET | Freq: Every day | ORAL | 1 refills | Status: DC

## 2016-06-22 MED ORDER — LACTATED RINGERS IV SOLN
INTRAVENOUS | Status: DC
  Administered 2016-06-22: 21:00:00 via INTRAVENOUS

## 2016-06-22 MED ORDER — NIFEDIPINE 10 MG PO CAPS
10.0000 mg | ORAL_CAPSULE | Freq: Once | ORAL | Status: AC
  Administered 2016-06-22: 10 mg via ORAL
  Filled 2016-06-22: qty 1

## 2016-06-22 MED ORDER — HYDRALAZINE HCL 20 MG/ML IJ SOLN: 10.0000 mg | Freq: Once | INTRAMUSCULAR | Status: DC | PRN

## 2016-06-22 MED ORDER — HYDRALAZINE HCL 20 MG/ML IJ SOLN
5.0000 mg | INTRAMUSCULAR | Status: DC | PRN
  Administered 2016-06-22: 5 mg via INTRAVENOUS
  Filled 2016-06-22 (×2): qty 1

## 2016-06-22 MED ORDER — AMLODIPINE BESYLATE 10 MG PO TABS
10.0000 mg | ORAL_TABLET | Freq: Every day | ORAL | Status: DC
  Administered 2016-06-22: 10 mg via ORAL
  Filled 2016-06-22 (×2): qty 1

## 2016-06-22 MED ORDER — OXYCODONE-ACETAMINOPHEN 5-325 MG PO TABS
1.0000 | ORAL_TABLET | Freq: Once | ORAL | Status: AC
  Administered 2016-06-22: 1 via ORAL
  Filled 2016-06-22: qty 1

## 2016-06-22 NOTE — Telephone Encounter (Signed)
Pt left message stating that she had a baby on 1/25 and went home with BP medication. She is concerned because her BP is still high. Please call back. I called pt and discussed her concerns.  She stated that yesterday her Bp values were 157/95, then 159/95 later in the day.  Today the values were 143/100 this am, then 150/101 this afternoon.  She has episodes of dizziness and needs to lie down. She is taking the Hydrodiuril daily as prescribed. I advised pt that it is very important that she receive further evaluation today @ [facility] and asked if she can find childcare so that she can come to the hospital. She stated yes and agreed to come today for evaluation.

## 2016-06-22 NOTE — [facility] Note (Signed)
Urine sent to lab @1855 

## 2016-06-22 NOTE — Discharge Instructions (Signed)

## 2016-06-22 NOTE — [facility] Provider Note (Signed)
History     CSN: [id number]  Arrival date and time: 06/22/16 1844  Elevated blood pressure   Chief Complaint  Patient presents with  . Abdominal Cramping   Patient is a 31 year old G2 P2 who is postpartum day #3, repeat low transverse cesarean section for preeclampsia. At that time she was admitted and given magnesium preoperatively and then for 24 hours postoperatively. Postpartum her blood pressures were well controlled. She was discharged home on 25 mg of hydrochlorothiazide. Patient called for an appointment today and reported elevated blood pressures to the nursing staff. At that time they referred her here to the [facility]. She does report she has chronic headache which is better than it has been added to out of 10 today. It is not migrainous as it was when she came in and delivered. She denies any significant swelling or right upper quadrant pain. She denies any visual changes. She does report she has significant cramping with breast-feeding and has been taking her Percocet as prescribed.    OB History    Gravida Para Term Preterm AB Living   2 2 2  0 0 2   SAB TAB Ectopic Multiple Live Births   0 0 0 0 2      Past Medical History:  Diagnosis Date  . Anemia   . Anxiety   . Gestational diabetes   . Migraine     Past Surgical History:  Procedure Laterality Date  . APPENDECTOMY     6 yoa  . CESAREAN SECTION N/A 12/04/2012   Procedure: Primary CESAREAN SECTION of baby girl at 2143 APGAR 9/9;  Surgeon: Esmeralda ArthurSandra A Rivard, MD;  Location: [facility];  Service: Obstetrics;  Laterality: N/A;  . CESAREAN SECTION N/A 06/18/2016   Procedure: CESAREAN SECTION;  Surgeon: Hermina StaggersMichael L Ervin, MD;  [facility];  Service: Obstetrics;  Laterality: N/A;    Family History  Problem Relation Age of Onset  . Gestational diabetes Mother   . Seizures Brother     Social History  Substance Use Topics  . Smoking status: Former Smoker    Packs/day: 1.00    Types: Cigarettes    Quit  date: 10/28/2015  . Smokeless tobacco: Never Used  . Alcohol use No    Allergies: No Known Allergies  Prescriptions Prior to Admission  Medication Sig Dispense Refill Last Dose  . calcium carbonate (TUMS - DOSED IN MG ELEMENTAL CALCIUM) 500 MG chewable tablet Chew 2 tablets by mouth at bedtime as needed for indigestion or heartburn.    Past Month at Unknown time  . docusate sodium (COLACE) 100 MG capsule Take 1 capsule (100 mg total) by mouth 2 (two) times daily as needed. (Patient taking differently: Take 100 mg by mouth 2 (two) times daily as needed for mild constipation. ) 30 capsule 2 06/22/2016 at Unknown time  . ferrous sulfate 325 (65 FE) MG tablet Take 1 tablet (325 mg total) by mouth 2 (two) times daily with a meal. 60 tablet 3 06/22/2016 at Unknown time  . hydrochlorothiazide (HYDRODIURIL) 25 MG tablet Take 1 tablet (25 mg total) by mouth daily. 30 tablet 1 06/22/2016 at Unknown time  . oxyCODONE-acetaminophen (PERCOCET/ROXICET) 5-325 MG tablet Take 1-2 tablets by mouth every 6 (six) hours as needed. (Patient taking differently: Take 1-2 tablets by mouth every 6 (six) hours as needed for moderate pain. ) 30 tablet 0 06/22/2016 at Unknown time  . Prenatal Vit-Fe Fumarate-FA (PRENATAL COMPLETE) 14-0.4 MG TABS Take 1 tablet by mouth daily.  60 each 0 06/22/2016 at Unknown time    Review of Systems  Constitutional: Negative for chills, fatigue and fever.  HENT: Negative for congestion and rhinorrhea.   Respiratory: Negative for apnea, cough and shortness of breath.   Cardiovascular: Negative for chest pain, palpitations and leg swelling.  Gastrointestinal: Positive for abdominal pain. Negative for abdominal distention, constipation, diarrhea, nausea and vomiting.  Genitourinary: Negative for difficulty urinating, dysuria and flank pain.  Neurological: Negative for dizziness, tremors and weakness.   Physical Exam   Blood pressure 129/87, pulse 108, temperature 98.4 F (36.9 C), temperature  source Oral, resp. rate 18, weight 174 lb (78.9 kg), last menstrual period 09/13/2015, unknown if currently breastfeeding.  Physical Exam  Constitutional: She is oriented to person, place, and time. She appears well-developed and well-nourished.  HENT:  Head: Normocephalic and atraumatic.  Cardiovascular: Normal rate and intact distal pulses.   Respiratory: Effort normal. No respiratory distress.  GI: Soft. She exhibits no distension. There is no tenderness. There is no rebound and no guarding.  Incision with Honey, in place and approximately 20% serous sanguinous discharge on the honeycomb  Neurological: She is alert and oriented to person, place, and time. She displays normal reflexes. No cranial nerve deficit. She exhibits normal muscle tone. Coordination normal.  Skin: Skin is warm and dry.  Psychiatric: She has a normal mood and affect. Her behavior is normal.    [facility] Course  Procedures  MDM In the [facility] patient initially had blood pressures in the 160s over 90s. -Attempted to control blood pressure with oral medications by mouth nifedipine 10 mg was given in addition to 5 mg of Percocet. -She continued to elevate and was as high as 190s over 113. One dose of IV hydralazine.  -Following this her blood pressure was consistently in the 130s over 80s for over an hour and a half.  -Patient was given a dose of 10 mg of Norvasc prior to discharge.  -Patient instructed to follow up in 2-3 days for blood pressure check and continue Norvasc as well as hydrochlorothiazide.  Assessment and Plan  #1: Postpartum preeclampsia: Patient with likely uncontrolled blood pressure following preeclampsia with severe features at delivery. Increase patient's blood pressure medications to continue 25 mg hydrochlorothiazide and 10 mg of Norvasc. Patient voiced understanding with instructions and will follow up in 2-3 days in the office for blood pressure check. Patient remained asymptomatic and was ready for  discharge home.

## 2016-06-22 NOTE — [facility] Note (Addendum)
PT  SAYS SHE DEL ON Friday  1-25-   C/S-    BECAUSE  OF  BP.    ON  BP MED-  TAKING  AS  SCH.     SHE  CALLED CLINIC THIS AM TO SCH FOLLOW-UP -    TOLD  NURSE SHE IS  TAKING  BP AT  HOME -  AND  FEELS  DIZZY SOMETIMES  - SO NURSE  TOLD  HER  TO COME  TO [facility].   PT  SAYS   SHE WOULD  NOT  HAVE   COME    TO [facility]U.      TOOK  PAIN MED  AT  2 PM.     BREASTFEEDING .       AT 530PM-   BP  WAS 150/104

## 2016-06-26 ENCOUNTER — Ambulatory Visit: Payer: Self-pay | Admitting: *Deleted

## 2016-06-26 VITALS — BP 130/93 | HR 93

## 2016-06-26 DIAGNOSIS — O1494 Unspecified pre-eclampsia, complicating childbirth: Secondary | ICD-10-CM

## 2016-06-26 MED ORDER — IBUPROFEN 800 MG PO TABS: 800.0000 mg | ORAL_TABLET | Freq: Three times a day (TID) | ORAL | 2 refills | Status: DC | PRN

## 2016-06-26 NOTE — Progress Notes (Signed)
Pt states she has occasional headaches but has not been eating well because she thought it would make her BP become elevated. I advised pt that eating will not cause her BP to become elevated and she should eat normally. After consult with Dr. Macon LargeAnyanwu, pt was also advised to continue HCTZ as prescribed, return to hospital for sx of pre-eclampsia and to keep scheduled appt on 2/27.  Pt voiced understanding and agreed to plan of care

## 2016-06-29 ENCOUNTER — Telehealth: Payer: Self-pay | Admitting: General Practice

## 2016-06-29 NOTE — Telephone Encounter (Signed)
Patient called into front office stating she only has 3 oxycodone left not 6 like she told someone & she is still having cramping with breastfeeding. Patient states tylenol isn't helping & she would like someone to call her back. Called patient back and discussed normal aches/pains after delivery/c-section. Explained that she will cramp with breastfeeding as her uterus is contracting to go back to normal pre-pregnant size. Told patient that should continue to improve each day. Recommend she take ibuprofen around the clock, not just as needed & then take a percocet as needed. Patient verbalized understanding to all & states her top dressing came off and there are little pieces of tape underneath and one came off. Told patient it's fine if the top dressing is off and the pieces of tape left are called steri strips. Told patient to leave those & not peel them off and that it's okay if one or two come off. Also reviewed incision care. Patient verbalized understanding to all & had no questions

## 2016-07-01 ENCOUNTER — Encounter (HOSPITAL_COMMUNITY): Payer: Self-pay

## 2016-07-03 ENCOUNTER — Ambulatory Visit: Payer: Self-pay

## 2016-07-14 ENCOUNTER — Encounter (HOSPITAL_COMMUNITY): Payer: Self-pay | Admitting: Emergency Medicine

## 2016-07-14 ENCOUNTER — Emergency Department (HOSPITAL_COMMUNITY)
Admission: EM | Admit: 2016-07-14 | Discharge: 2016-07-14 | Disposition: A | Discharge status: Home / Self Care | Payer: Medicaid Other | Attending: Emergency Medicine | Admitting: Emergency Medicine

## 2016-07-14 DIAGNOSIS — M654 Radial styloid tenosynovitis [de Quervain]: Secondary | ICD-10-CM | POA: Diagnosis not present

## 2016-07-14 DIAGNOSIS — Z87891 Personal history of nicotine dependence: Secondary | ICD-10-CM | POA: Diagnosis not present

## 2016-07-14 DIAGNOSIS — M25531 Pain in right wrist: Secondary | ICD-10-CM

## 2016-07-14 DIAGNOSIS — Z79899 Other long term (current) drug therapy: Secondary | ICD-10-CM | POA: Insufficient documentation

## 2016-07-14 NOTE — ED Provider Notes (Signed)
[facility]-EMERGENCY DEPT Provider Note   CSN: [id number] Arrival date & time: 07/14/16  1907  By signing my name below, I, Ashley JanskyAlbert Thayil, attest that this documentation has been prepared under the direction and in the presence of non-physician practitioner, [address]Mercedes Alejandria Wessells, PA-C. Electronically Signed: [name], Scribe. 07/14/2016. 9:32 PM.  History   Chief Complaint Chief Complaint  Patient presents with  . Wrist Pain   The history is provided by the patient and medical records. No language interpreter was used.  Wrist Pain  This is a new problem. The current episode started more than 1 week ago. The problem occurs daily. The problem has not changed since onset.The symptoms are aggravated by bending and twisting (movement of wrist). Nothing relieves the symptoms. She has tried nothing for the symptoms. The treatment provided no relief.   HPI Comments: Ashley BasemanBarbara F Dehnert is a 31 y.o. female who presents to the Emergency Department complaining of constant moderate right wrist pain that started about 3 weeks ago. No known trauma. She suspects pain is due to an IV placed on her right forearm (FAR above the wrist, closer to the mid-forearm/AC region) after giving birth, however she isn't sure, and also mentions that she's being doing a lot of repetitive movements with her wrist since her child was born. Pt describes the pain as intermittent, throbbing, non-radiating, 3/10, R wrist pain near the anatomical snuffbox region, which is exacerbated by movement/picking up objects, and with no known alleviating factors given that she has not tried anything for her symptoms. Pt reports associated wrist swelling. Pt denies any bruising, warmth, redness, numbness/tingling, focal weakness, or arm swelling, or any other complaints at this time. Denies having an IV in the wrist.   Past Medical History:  Diagnosis Date  . Anemia   . Anxiety   . Gestational diabetes   . Migraine     Patient Active Problem List   Diagnosis Date Noted  . Severe pre-eclampsia 06/18/2016  . GBS (group B Streptococcus carrier), +RV culture, currently pregnant 06/04/2016  . Previous gestational diabetes mellitus, antepartum 12/16/2015  . Normal pregnancy in multigravida, antepartum 12/12/2015  . Previous cesarean section complicating pregnancy, antepartum condition or complication 12/12/2015  . Anxiety   . Migraine     Past Surgical History:  Procedure Laterality Date  . APPENDECTOMY     6 yoa  . CESAREAN SECTION N/A 12/04/2012   Procedure: Primary CESAREAN SECTION of baby girl at 2143 APGAR 9/9;  Surgeon: Esmeralda ArthurSandra A Rivard, MD;  Location: [facility];  Service: Obstetrics;  Laterality: N/A;  . CESAREAN SECTION N/A 06/18/2016   Procedure: CESAREAN SECTION;  Surgeon: Hermina StaggersMichael L Ervin, MD;  [facility];  Service: Obstetrics;  Laterality: N/A;    OB History    Gravida Para Term Preterm AB Living   2 2 2  0 0 2   SAB TAB Ectopic Multiple Live Births   0 0 0 0 2       Home Medications    Prior to Admission medications   Medication Sig Start Date End Date Taking? Authorizing Provider  amLODipine (NORVASC) 10 MG tablet Take 1 tablet (10 mg total) by mouth daily. 06/22/16   Lorne SkeensNicholas Michael Schenk, MD  calcium carbonate (TUMS - DOSED IN MG ELEMENTAL CALCIUM) 500 MG chewable tablet Chew 2 tablets by mouth at bedtime as needed for indigestion or heartburn.     Historical Provider, MD  docusate sodium (COLACE) 100 MG capsule Take 1 capsule (100 mg total) by mouth  2 (two) times daily as needed. Patient taking differently: Take 100 mg by mouth 2 (two) times daily as needed for mild constipation.  06/20/16   Tereso Newcomer, MD  ferrous sulfate 325 (65 FE) MG tablet Take 1 tablet (325 mg total) by mouth 2 (two) times daily with a meal. 06/20/16   Tereso Newcomer, MD  hydrochlorothiazide (HYDRODIURIL) 25 MG tablet Take 1 tablet (25 mg total) by mouth daily. 06/20/16   Tereso Newcomer, MD  ibuprofen (ADVIL,MOTRIN) 800  MG tablet Take 1 tablet (800 mg total) by mouth every 8 (eight) hours as needed. 06/26/16   Tereso Newcomer, MD  oxyCODONE-acetaminophen (PERCOCET/ROXICET) 5-325 MG tablet Take 1-2 tablets by mouth every 6 (six) hours as needed. Patient taking differently: Take 1-2 tablets by mouth every 6 (six) hours as needed for moderate pain.  06/20/16   Tereso Newcomer, MD  Prenatal Vit-Fe Fumarate-FA (PRENATAL COMPLETE) 14-0.4 MG TABS Take 1 tablet by mouth daily. 10/22/15   Jacalyn Lefevre, MD    Family History Family History  Problem Relation Age of Onset  . Gestational diabetes Mother   . Seizures Brother     Social History Social History  Substance Use Topics  . Smoking status: Former Smoker    Packs/day: 1.00    Types: Cigarettes    Quit date: 10/28/2015  . Smokeless tobacco: Never Used  . Alcohol use No     Allergies   Patient has no known allergies.   Review of Systems Review of Systems  Musculoskeletal: Positive for arthralgias and joint swelling. Negative for myalgias.  Skin: Negative for color change.  Allergic/Immunologic: Negative for immunocompromised state.  Neurological: Negative for weakness and numbness.  Psychiatric/Behavioral: Negative for confusion.   10 Systems reviewed and all are negative for acute change except as noted in the HPI.  Physical Exam Updated Vital Signs BP 128/80 (BP Location: Left Arm)   Pulse 101   Temp 98.4 F (36.9 C) (Oral)   Resp 12   Ht 5\' 5"  (1.651 m)   Wt 153 lb 5 oz (69.5 kg)   SpO2 98%   BMI 25.51 kg/m   Physical Exam  Constitutional: She is oriented to person, place, and time. Vital signs are normal. She appears well-developed and well-nourished.  Non-toxic appearance. No distress.  Afebrile, nontoxic, NAD  HENT:  Head: Normocephalic and atraumatic.  Mouth/Throat: Mucous membranes are normal.  Eyes: Conjunctivae and EOM are normal. Right eye exhibits no discharge. Left eye exhibits no discharge.  Neck: Normal range of motion.  Neck supple.  Cardiovascular: Normal rate and intact distal pulses.   Pulmonary/Chest: Effort normal. No respiratory distress.  Abdominal: Normal appearance. She exhibits no distension.  Musculoskeletal: Normal range of motion.       Right wrist: She exhibits tenderness. She exhibits normal range of motion, no bony tenderness, no swelling, no effusion, no crepitus, no deformity and no laceration.  Right wrist with FROM intact, with mild TTP to the abductor pollicis longus and extensor pollicis brevis tendons, with no focal bony or joint line TTP, no bruising or abrasions, no swelling or deformity, no crepitus or effusion, no erythema or warmth, with +finklestein's test. Strength and sensation grossly intact, distal pulses intact, compartments soft.  Neurological: She is alert and oriented to person, place, and time. She has normal strength. No sensory deficit.  Skin: Skin is warm, dry and intact. No rash noted.  Psychiatric: She has a normal mood and affect. Her behavior is normal.  Nursing note and vitals reviewed.    ED Treatments / Results  DIAGNOSTIC STUDIES: Oxygen Saturation is 98% on RA, normal by my interpretation.    COORDINATION OF CARE: 9:36 PM- Pt advised of plan for treatment and pt agrees.  Labs (all labs ordered are listed, but only abnormal results are displayed) Labs Reviewed - No data to display  EKG  EKG Interpretation None       Radiology No results found.  Procedures Procedures (including critical care time)  Medications Ordered in ED Medications - No data to display   Initial Impression / Assessment and Plan / ED Course  I have reviewed the triage vital signs and the nursing notes.  Pertinent labs & imaging results that were available during my care of the patient were reviewed by me and considered in my medical decision making (see chart for details).     31 y.o. female here with R wrist pain x3 wks since having a new baby; had IV in R forearm,  but it was not in the wrist area. No swelling, bruising, erythema, or warmth appreciated, NVI with soft compartments, tenderness over abductor pollicis longus and extensor pollicis brevis tendons, with +finkelstein's test, c/w de quervain's tenosynovitis. Doubt DVT or superficial thrombophlebitis. Doubt need for further emergent work up or imaging. Will give thumb spica splint for rest/comfort, discussed RICE, tylenol/motrin for pain, and f/up with hand specialist in 1-2wks for recheck of symptoms. I explained the diagnosis and have given explicit precautions to return to the ER including for any other new or worsening symptoms. The patient understands and accepts the medical plan as it's been dictated and I have answered their questions. Discharge instructions concerning home care and prescriptions have been given. The patient is STABLE and is discharged to home in good condition.   I personally performed the services described in this documentation, which was scribed in my presence. The recorded information has been reviewed and is accurate.   Final Clinical Impressions(s) / ED Diagnoses   Final diagnoses:  De Quervain's tenosynovitis, right  Right wrist pain    New Prescriptions New Prescriptions   No medications on file     [address] Lobue, PA-C 07/14/16 2159    Rolland Porter, MD 07/15/16 1549

## 2016-07-14 NOTE — ED Triage Notes (Addendum)
Patient reports right wrist pain x2 weeks. Reports having an IV in the right forearm after having a baby 2 weeks ago. States pain increases with movement. Denies injury. No redness noted. +2 right radial pulse

## 2016-07-14 NOTE — Discharge Instructions (Signed)
Wear wrist brace as needed for comfort and to rest your wrist. Use ice and heat to your wrist, and elevate wrist throughout the day, using ice/heat pack for no more than 20 minutes every hour.  Alternate between tylenol and motrin as needed for pain relief. Call hand specialist follow up today or tomorrow to schedule followup appointment for recheck of ongoing wrist pain in 1-2 weeks that can be canceled with a 24-48 hour notice if complete resolution of pain. Return to the ER for changes or worsening symptoms.

## 2016-07-14 NOTE — ED Notes (Signed)
Pt ambulatory and independent at discharge.  

## 2016-07-21 ENCOUNTER — Encounter: Payer: Self-pay | Admitting: Medical

## 2016-07-21 ENCOUNTER — Ambulatory Visit (INDEPENDENT_AMBULATORY_CARE_PROVIDER_SITE_OTHER): Payer: Medicaid Other | Admitting: Medical

## 2016-07-21 DIAGNOSIS — D649 Anemia, unspecified: Secondary | ICD-10-CM

## 2016-07-21 DIAGNOSIS — Z3049 Encounter for surveillance of other contraceptives: Secondary | ICD-10-CM | POA: Diagnosis not present

## 2016-07-21 DIAGNOSIS — Z98891 History of uterine scar from previous surgery: Secondary | ICD-10-CM

## 2016-07-21 DIAGNOSIS — M25531 Pain in right wrist: Secondary | ICD-10-CM

## 2016-07-21 DIAGNOSIS — Z3202 Encounter for pregnancy test, result negative: Secondary | ICD-10-CM

## 2016-07-21 DIAGNOSIS — Z30017 Encounter for initial prescription of implantable subdermal contraceptive: Secondary | ICD-10-CM

## 2016-07-21 DIAGNOSIS — Z8759 Personal history of other complications of pregnancy, childbirth and the puerperium: Secondary | ICD-10-CM

## 2016-07-21 MED ORDER — ETONOGESTREL 68 MG ~~LOC~~ IMPL
68.0000 mg | DRUG_IMPLANT | Freq: Once | SUBCUTANEOUS | Status: AC
  Administered 2016-07-21: 68 mg via SUBCUTANEOUS

## 2016-07-21 NOTE — Patient Instructions (Signed)
Contraceptive Implant Information A contraceptive implant is a small, plastic rod that is inserted under the skin. The implant releases a hormone into the bloodstream that prevents pregnancy. Contraceptive implants can be effective for up to 3 years. They do not provide protection against STIs (sexually transmitted infections). How does the implant work? Contraceptive implants prevent pregnancy by releasing a small amount of progestin into the bloodstream. Progestin has similar effects to the hormone progesterone, which plays a role in menstrual periods and pregnancy. Progestin will:  Stop the ovaries from releasing eggs.  Thicken cervical mucus to prevent sperm from entering the cervix.  Thin out the lining of the uterus to prevent a fertilized egg from attaching to the wall of the uterus.  What are the advantages of this form of birth control? The advantages of this form of birth control include the following:  It is very effective at preventing pregnancy.  It is effective for up to 3 years.  It can easily be removed.  It does not interfere with sex or daily activities.  It can be used when breastfeeding.  It can be used by women who cannot take estrogen.  The procedure to insert the device is quick.  Women can get pregnant shortly after removing the device.  What are the disadvantages of this form of birth control? The disadvantages of this form of birth control include the following:  It can cause side effects, including: ? Irregular menstrual periods or bleeding. ? Headache. ? Weight gain. ? Acne. ? Breast tenderness. ? Abdomen (abdominal) pain. ? Mood changes, such as depression.  It does not protect against STIs.  You must make an office visit to have it inserted and removed by a trained clinician.  Inserting or removing the device can result in pain, scarring, and tissue or nerve damage (rare).  How is this implant inserted? The procedure to insert an implant  only takes a few minutes. During the procedure:  Your upper arm will be numbed with a numbing medicine (local anesthetic).  The implant will be injected under the skin of your upper arm with a needle.  After the procedure:  You may experience minor bruising, swelling, or discomfort at the insertion site. This should only last for a couple of days.  You may need to use another, non-hormonal contraceptive such as a condom for 7 days after the procedure.  How is the implant removed? The implant should be removed after 3 years or as directed by your health care provider. The procedure to remove the implant only takes a few minutes. During this procedure:  Your upper arm will be numbed with a local anesthetic.  A small incision will be made near the implant.  The implant will be removed with a small pair of forceps.  After the implant is removed:  The effect of the implant will wear off a few hours after removal. Most women will be able to get pregnant within 3 weeks of removal.  A new implant can be inserted as soon as the old one is removed, if desired.  You may experience minor bruising, swelling, or discomfort at the removal site. This should only last for a couple of days.  Is this implant right for me? Your health care provider can help you determine whether you are good candidate for a contraceptive implant. Make sure to discuss the possible side effects with your health care provider. You should not get the implant if you:  Are pregnant.  Are allergic   to any part of the implant.  Have a history of: ? Breast cancer. ? Unusual bleeding from the vagina. ? Heart disease. ? Stroke. ? Liver disease or tumors. ? Migraines.  Summary  A contraceptive implant is a small, plastic rod that is inserted under the skin. The implant releases a hormone into the bloodstream that prevents pregnancy.  Contraceptive implants can be effective for up to 3 years.  The implant works by  preventing ovaries from releasing eggs, thickening the cervical mucus, and thinning the uterine wall.  This form of birth control is very effective at preventing pregnancy and can be inserted and removed quickly. Women can get pregnant shortly after the device is removed.  This form of birth control can cause some side effects, including weight gain, breast tenderness, headaches, irregular periods or bleeding, acne, abdominal pain, and depression. It does not provide protection against STIs (sexually transmitted infections). This information is not intended to replace advice given to you by your health care provider. Make sure you discuss any questions you have with your health care provider. Document Released: 04/30/2011 Document Revised: 04/25/2016 Document Reviewed: 04/25/2016 Elsevier Interactive Patient Education  2017 Elsevier Inc[employer]  

## 2016-07-21 NOTE — Progress Notes (Signed)
Subjective:     Dorothey BasemanBarbara F Hutzler is a 31 y.o. female who presents for a postpartum visit. She is 4 weeks postpartum following a low cervical transverse Cesarean section. I have fully reviewed the prenatal and intrapartum course. The delivery was at 39 gestational weeks. Outcome: repeat cesarean section, low transverse incision. Anesthesia: spinal. Postpartum course has been complicated by preeclampsia, patient now on Norvasc and HCTZ to control BP. Baby's course has been uncomplicated. Baby is feeding by both breast and bottle - Enfamil Nutramigen. Bleeding no bleeding. Bowel function is abnormal: constipation. Bladder function is normal. Patient is not sexually active. Contraception method is abstinence. Postpartum depression screening: negative.  The following portions of the patient's history were reviewed and updated as appropriate: allergies, current medications, past family history, past medical history, past social history, past surgical history and problem list.  Review of Systems Pertinent items are noted in HPI.   Objective:    BP 115/78   Pulse 82   Ht 5\' 5"  (1.651 m)   Wt 150 lb (68 kg)   Breastfeeding? Yes   BMI 24.96 kg/m   General:  alert and cooperative   Breasts:  not performed  Lungs: clear to auscultation bilaterally  Heart:  regular rate and rhythm, S1, S2 normal, no murmur, click, rub or gallop  Abdomen: soft, non-tender; bowel sounds normal; no masses,  no organomegaly   Vulva:  not evaluated  Vagina: not evaluated  Cervix:  not evaluated  Corpus: not examined  Adnexa:  not evaluated  Rectal Exam: Not performed.         GYNECOLOGY CLINIC PROCEDURE NOTE  Nexplanon Insertion Procedure Patient was given informed consent, she signed consent form.  Patient does understand that irregular bleeding is a very common side effect of this medication. She was advised to have backup contraception for one week after placement. Pregnancy test in clinic today was negative.   Appropriate time out taken.  Patient's left arm was prepped and draped in the usual sterile fashion.. The ruler used to measure and mark insertion area.  Patient was prepped with alcohol swab and then injected with 3 ml of 1% lidocaine.  She was prepped with betadine, Nexplanon removed from packaging,  Device confirmed in needle, then inserted full length of needle and withdrawn per handbook instructions. Nexplanon was able to palpated in the patient's arm; patient palpated the insert herself. There was minimal blood loss.  Patient insertion site covered with guaze and a pressure bandage to reduce any bruising.  The patient tolerated the procedure well and was given post procedure instructions.   Assessment:     Normal postpartum exam. Pap smear not done at today's visit. Last pap 2017.  H/O Pre-eclampsia, severe Wrist pain   Plan:    1. Contraception: Nexplanon 2. Ambulatory Referral to Orthopedics for wrist pain 3. CBC today to follow-up on anemia PP 4. Discontinue HCTZ, continue Norvasc due to hypotension, BP check on Monday at [facility] 5. Follow up in: 6 days for BP check or sooner as needed.    Marny LowensteinJulie N Jadia Capers, PA-C 07/21/2016 9:26 AM

## 2016-07-21 NOTE — Addendum Note (Signed)
Addended by: Garret ReddishBARNES, Rexanna Louthan M on: 07/21/2016 12:40 PM   Modules accepted: Orders

## 2016-07-22 LAB — CBC
Hematocrit: 35.1 % (ref 34.0–46.6)
Hemoglobin: 11.1 g/dL (ref 11.1–15.9)
MCH: 24.2 pg — ABNORMAL LOW (ref 26.6–33.0)
MCHC: 31.6 g/dL (ref 31.5–35.7)
MCV: 77 fL — ABNORMAL LOW (ref 79–97)
PLATELETS: 294 10*3/uL (ref 150–379)
RBC: 4.59 x10E6/uL (ref 3.77–5.28)
RDW: 22.3 % — AB (ref 12.3–15.4)
WBC: 6.6 10*3/uL (ref 3.4–10.8)

## 2016-07-24 ENCOUNTER — Ambulatory Visit (INDEPENDENT_AMBULATORY_CARE_PROVIDER_SITE_OTHER): Payer: Medicaid Other

## 2016-07-24 ENCOUNTER — Encounter (INDEPENDENT_AMBULATORY_CARE_PROVIDER_SITE_OTHER): Payer: Self-pay | Admitting: Orthopaedic Surgery

## 2016-07-24 ENCOUNTER — Ambulatory Visit (INDEPENDENT_AMBULATORY_CARE_PROVIDER_SITE_OTHER): Payer: Medicaid Other | Admitting: Orthopaedic Surgery

## 2016-07-24 DIAGNOSIS — M654 Radial styloid tenosynovitis [de Quervain]: Secondary | ICD-10-CM

## 2016-07-24 MED ORDER — DICLOFENAC SODIUM 1 % TD GEL: 2.0000 g | Freq: Four times a day (QID) | TRANSDERMAL | 5 refills | Status: DC

## 2016-07-24 MED ORDER — PREDNISONE 10 MG (21) PO TBPK: ORAL_TABLET | ORAL | 0 refills | Status: DC

## 2016-07-24 NOTE — Progress Notes (Signed)
Office Visit Note   Patient: Ashley Curtis           Date of Birth: [birth year]           MRN: [id number] Visit Date: 07/24/2016              Requested by: Marny Lowenstein, PA-C [address] [place], [place] [address] PCP: No PCP Per Patient   Assessment & Plan: Visit Diagnoses:  1. De Quervain's tenosynovitis, right     Plan: Patient has significant and symptomatic right de Quervain's tenosynovitis. I offered an injection which she declined. She'll like to go ahead and try wearing the brace more regularly and given her prescription for prednisone and diclofenac gel. I will see her back as needed. She'll let us know if she doesn't get better and she wants  Follow-Up Instructions: Return if symptoms worsen or fail to improve.   Orders:  Orders Placed This Encounter  Procedures  . XR Wrist Complete Right   Meds ordered this encounter  Medications  . predniSONE (STERAPRED UNI-PAK 21 TAB) 10 MG (21) TBPK tablet    Sig: Take as directed    Dispense:  21 tablet    Refill:  0  . diclofenac sodium (VOLTAREN) 1 % GEL    Sig: Apply 2 g topically 4 (four) times daily.    Dispense:  1 Tube    Refill:  5      Procedures: No procedures performed   Clinical Data: No additional findings.   Subjective: Chief Complaint  Patient presents with  . Right Wrist - Pain    Patient comes in with a four-week history of severe right radial sided wrist pain. She had a newborn at that time. She's been doing a lot with her wrist she has +5 out of 10 pain is worse in the morning of the radial side. Radiates up the forearm. Denies any numbness or tingling. He denies any injuries. She has been wearing a thumb spica brace but this annoys her.    Review of Systems  Constitutional: Negative.   HENT: Negative.   Eyes: Negative.   Respiratory: Negative.   Cardiovascular: Negative.   Endocrine: Negative.   Musculoskeletal: Negative.   Neurological: Negative.   Hematological:  Negative.   Psychiatric/Behavioral: Negative.   All other systems reviewed and are negative.    Objective: Vital Signs: There were no vitals taken for this visit.  Physical Exam  Constitutional: She is oriented to person, place, and time. She appears well-developed and well-nourished.  HENT:  Head: Normocephalic and atraumatic.  Eyes: EOM are normal.  Neck: Neck supple.  Pulmonary/Chest: Effort normal.  Abdominal: Soft.  Neurological: She is alert and oriented to person, place, and time.  Skin: Skin is warm. Capillary refill takes less than 2 seconds.  Psychiatric: She has a normal mood and affect. Her behavior is normal. Judgment and thought content normal.  Nursing note and vitals reviewed.   Ortho Exam Exam the right wrist shows mild swelling over the radial styloid. Positive Finkelstein's test. Negative intersection syndrome. No other findings on exam. Specialty Comments:  No specialty comments available.  Imaging: No results found.   PMFS History: Patient Active Problem List   Diagnosis Date Noted  . De Quervain's tenosynovitis, right 07/24/2016  . H/O severe pre-eclampsia 07/21/2016  . Previous cesarean section 07/21/2016  . Anxiety   . Migraine    Past Medical History:  Diagnosis Date  . Anemia   . Anxiety   .  Gestational diabetes   . Migraine     Family History  Problem Relation Age of Onset  . Gestational diabetes Mother   . Seizures Brother     Past Surgical History:  Procedure Laterality Date  . APPENDECTOMY     6 yoa  . CESAREAN SECTION N/A 12/04/2012   Procedure: Primary CESAREAN SECTION of baby girl at 2143 APGAR 9/9;  Surgeon: Esmeralda ArthurSandra A Rivard, MD;  Location: [facility];  Service: Obstetrics;  Laterality: N/A;  . CESAREAN SECTION N/A 06/18/2016   Procedure: CESAREAN SECTION;  Surgeon: Hermina StaggersMichael L Ervin, MD;  [facility];  Service: Obstetrics;  Laterality: N/A;   Social History   Occupational History  . Receptionist    Social  History Main Topics  . Smoking status: Former Smoker    Packs/day: 1.00    Types: Cigarettes    Quit date: 10/28/2015  . Smokeless tobacco: Never Used  . Alcohol use No  . Drug use: No  . Sexual activity: Yes    Partners: Male     Comment: Pills in the past

## 2016-07-28 ENCOUNTER — Ambulatory Visit: Payer: Medicaid Other | Admitting: *Deleted

## 2016-07-28 VITALS — BP 113/79 | HR 92

## 2016-07-28 DIAGNOSIS — Z0130 Encounter for examination of blood pressure without abnormal findings: Secondary | ICD-10-CM

## 2016-07-28 NOTE — Progress Notes (Signed)
Pt has not taken the norvasc. She is 5 weeks postpartum. Discussed with WUJWJXBWalidah and she advised that patient may discontinue her bp meds.

## 2016-07-30 ENCOUNTER — Telehealth: Payer: Self-pay | Admitting: *Deleted

## 2016-07-30 NOTE — Telephone Encounter (Signed)
Pt left message stating that sh would like to speak to a nurse. She had a C/S on 1/25 wants to have a tummy tuck. The doctor is requesting a referral. Please call back.

## 2016-08-06 NOTE — Telephone Encounter (Signed)
Call patient no answer and voice mail has not been set up

## 2016-08-06 NOTE — Telephone Encounter (Signed)
Spoke with patient regarding getting a tummy tuck. Per Magnus SinningWenzel patient should wait on getting a tummy tuck patient is still being treated for high blood pressure and she recently delivered by c-section in [place]AN. Patient stated she would like an appointment with the doctor who perform her c-section to discuss her options. I explained to patient she needs to follow up with her medical provider first but patient insisted on coming to our office. Appointment scheduled

## 2016-08-18 ENCOUNTER — Telehealth (HOSPITAL_COMMUNITY): Payer: Self-pay

## 2016-08-18 ENCOUNTER — Telehealth: Payer: Self-pay | Admitting: *Deleted

## 2016-08-18 NOTE — Telephone Encounter (Signed)
Pt called and had questions regarding safe medications (either OTC or Rx) that can be taken to assist with weight loss while breastfeeding. I advised pt that she will need to discuss with provider @ her scheduled visit on 3/29. She can also call the Lactation Consultant and ask the question so that she can be better prepared to discuss at the visit on 3/29. Telephone number was provided.  Pt voiced understanding.

## 2016-08-18 NOTE — Telephone Encounter (Signed)
Mom calls with questions about what weight loss medication she can take with nursing her 392 month old.  LC advised mom if she has a medication in mind I can look up in medication book, but this LC does not advise mom to take medication while breastfeeding.  Mom has appointment with OB later this week and will follow up then for further advise.

## 2016-08-20 ENCOUNTER — Encounter: Payer: Self-pay | Admitting: Obstetrics and Gynecology

## 2016-08-20 ENCOUNTER — Ambulatory Visit (INDEPENDENT_AMBULATORY_CARE_PROVIDER_SITE_OTHER): Payer: Medicaid Other | Admitting: Obstetrics and Gynecology

## 2016-08-20 VITALS — BP 125/88 | HR 79 | Temp 98.1°F | Ht 65.0 in | Wt 150.1 lb

## 2016-08-20 DIAGNOSIS — M79601 Pain in right arm: Secondary | ICD-10-CM

## 2016-08-20 DIAGNOSIS — Z711 Person with feared health complaint in whom no diagnosis is made: Secondary | ICD-10-CM

## 2016-08-20 NOTE — Progress Notes (Signed)
   Subjective:    Patient ID: Ashley Curtis, female    DOB: 1986-04-09, 31 y.o.   MRN: [id number]  [name] is a 31 year old G2 P2 who delivered via repeat low transverse cesarean section for severe preeclampsia. She is here today for follow-up with several questions. First she is asking if she can return to normal exercise. Her delivery was on January 25. She reports she's been walking 30 minutes daily on the treadmill and has been doing fine with this. Secondly she is wondering if there is any weight loss pill that would be safe to take with breast-feeding. She is interested in losing weight. She has been working on diet but her exercise has been limited to date. She is just looking for some other option. She reports she is continuing to have some anxiety and is asking if there is anything she can take as needed. I stated that there are lots of medicine she can take for anxiety that her state for breast-feeding but that she should follow up with her primary care doctor. Finally she is wondering when it is safe for her to get pregnant again, she has a nexplanon in place. She also wants to know if she decides not to get pregnant isn't safe for her to get a tummy tuck at this time. She also reports that she has been having pain in her right wrist since delivery. She reports she had an IV in her left wrist and her right forearm and area separate from her she's having pain. She reports the pain is worse in the morning. I recommended follow-up with her PCP.      Review of Systems  Constitutional: Negative for chills and fever.  HENT: Negative for congestion, rhinorrhea and sinus pressure.   Respiratory: Negative for chest tightness and shortness of breath.   Cardiovascular: Negative for chest pain and palpitations.  Gastrointestinal: Negative for abdominal distention, abdominal pain, constipation, diarrhea and nausea.  Genitourinary: Negative for difficulty urinating, dysuria, frequency and hematuria.    Musculoskeletal: Positive for arthralgias. Negative for myalgias.  Neurological: Negative for dizziness and weakness.  Psychiatric/Behavioral: Negative for agitation and suicidal ideas. The patient is nervous/anxious.        Objective:   Physical Exam  Constitutional: She is oriented to person, place, and time. She appears well-developed and well-nourished.  HENT:  Head: Normocephalic and atraumatic.  Neck: Neck supple.  Cardiovascular: Normal rate and intact distal pulses.   Pulmonary/Chest: Effort normal and breath sounds normal. No respiratory distress.  Abdominal: Soft. Bowel sounds are normal. She exhibits no distension. There is no tenderness. There is no rebound.  Musculoskeletal: Normal range of motion. She exhibits no edema.  Minimal tenderness to palpation over her right wrist full range of motion noted  Neurological: She is alert and oriented to person, place, and time. No cranial nerve deficit.  Skin: Skin is warm and dry.  Psychiatric:  Anxious appearing, no signs of depression judgment normal  Vitals reviewed.         Assessment & Plan:  #1: Arm pain: Patient with arm pain unclear etiology likely not related to her obstetrical history. Recommended follow up with PCP or orthopedics #2: Worried well patient with multiple questions answered them all to her satisfaction today. Okay to proceed with tummy tuck but should get medical clearance from PCP. Okay to exercise.  Ernestina PennaNicholas Halle Davlin, MD

## 2016-10-17 IMAGING — CR DG ANKLE COMPLETE 3+V*R*
3 series · 3 of 3 positions shown · non-contrast
Comparison: None.

CLINICAL DATA: Ankle pain and swelling for 2 days after twisting
injury. Initial encounter.

EXAM:
RIGHT ANKLE - COMPLETE 3+ VIEW

[x ankle ap right]
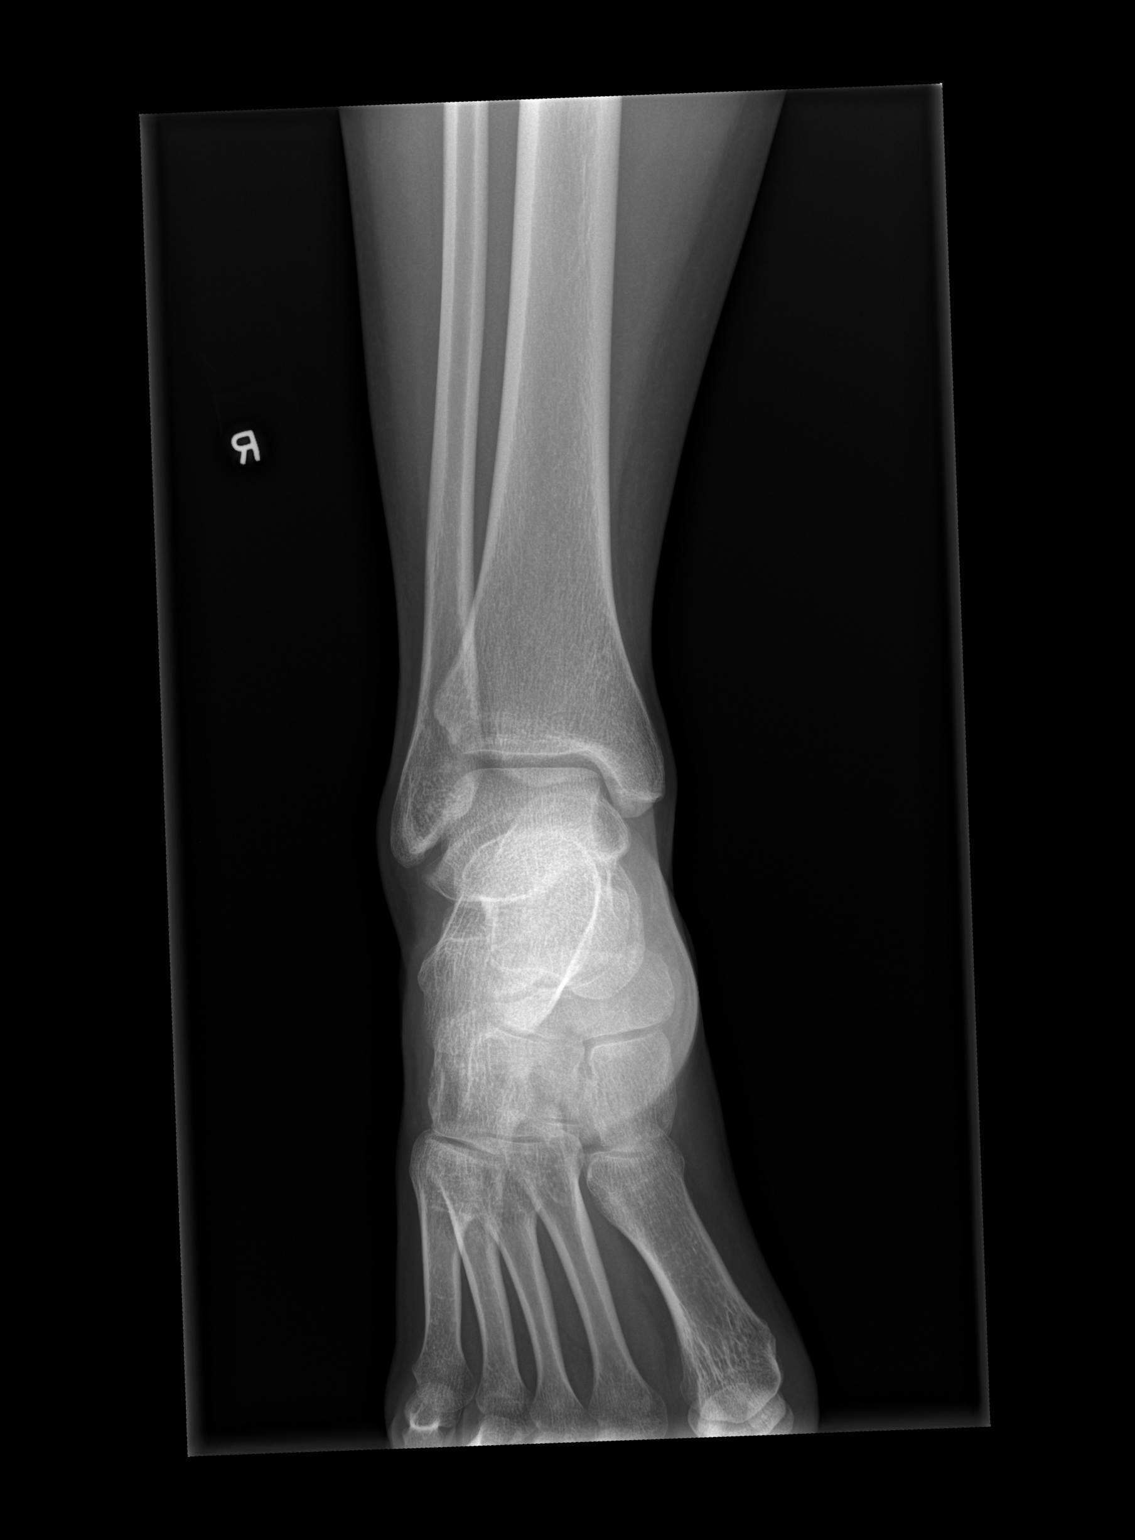

[x ankle obl right]
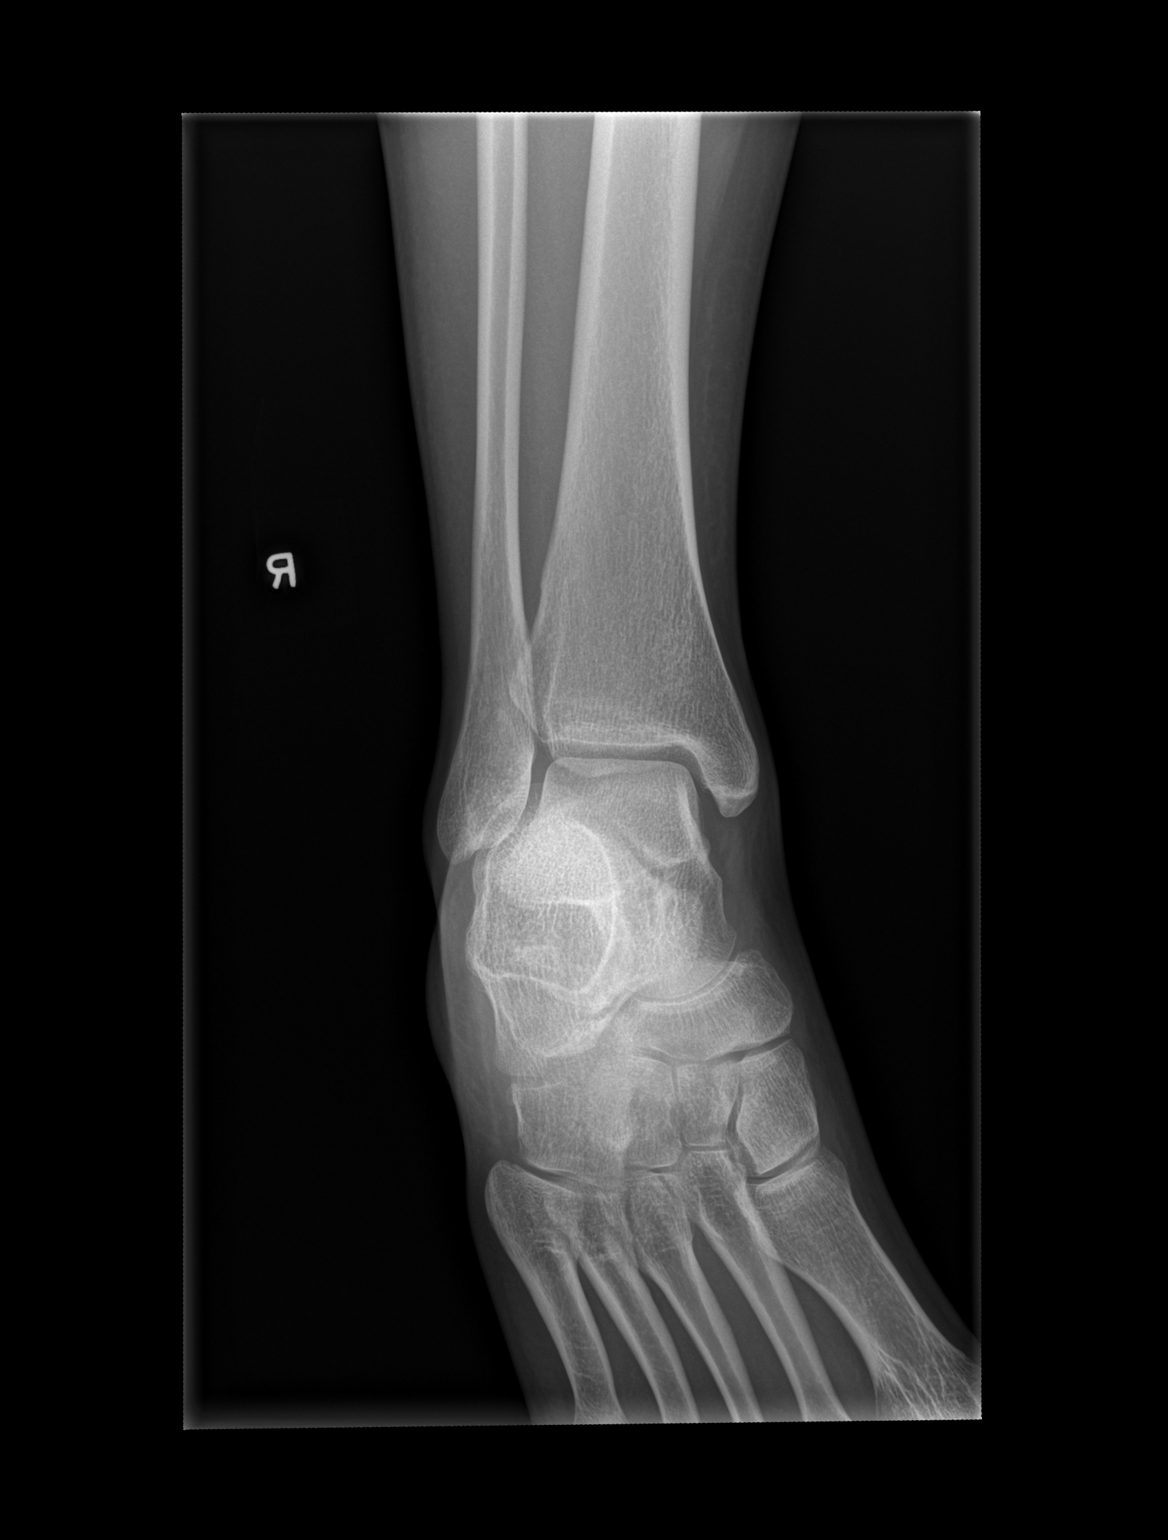

[x ankle lat right]
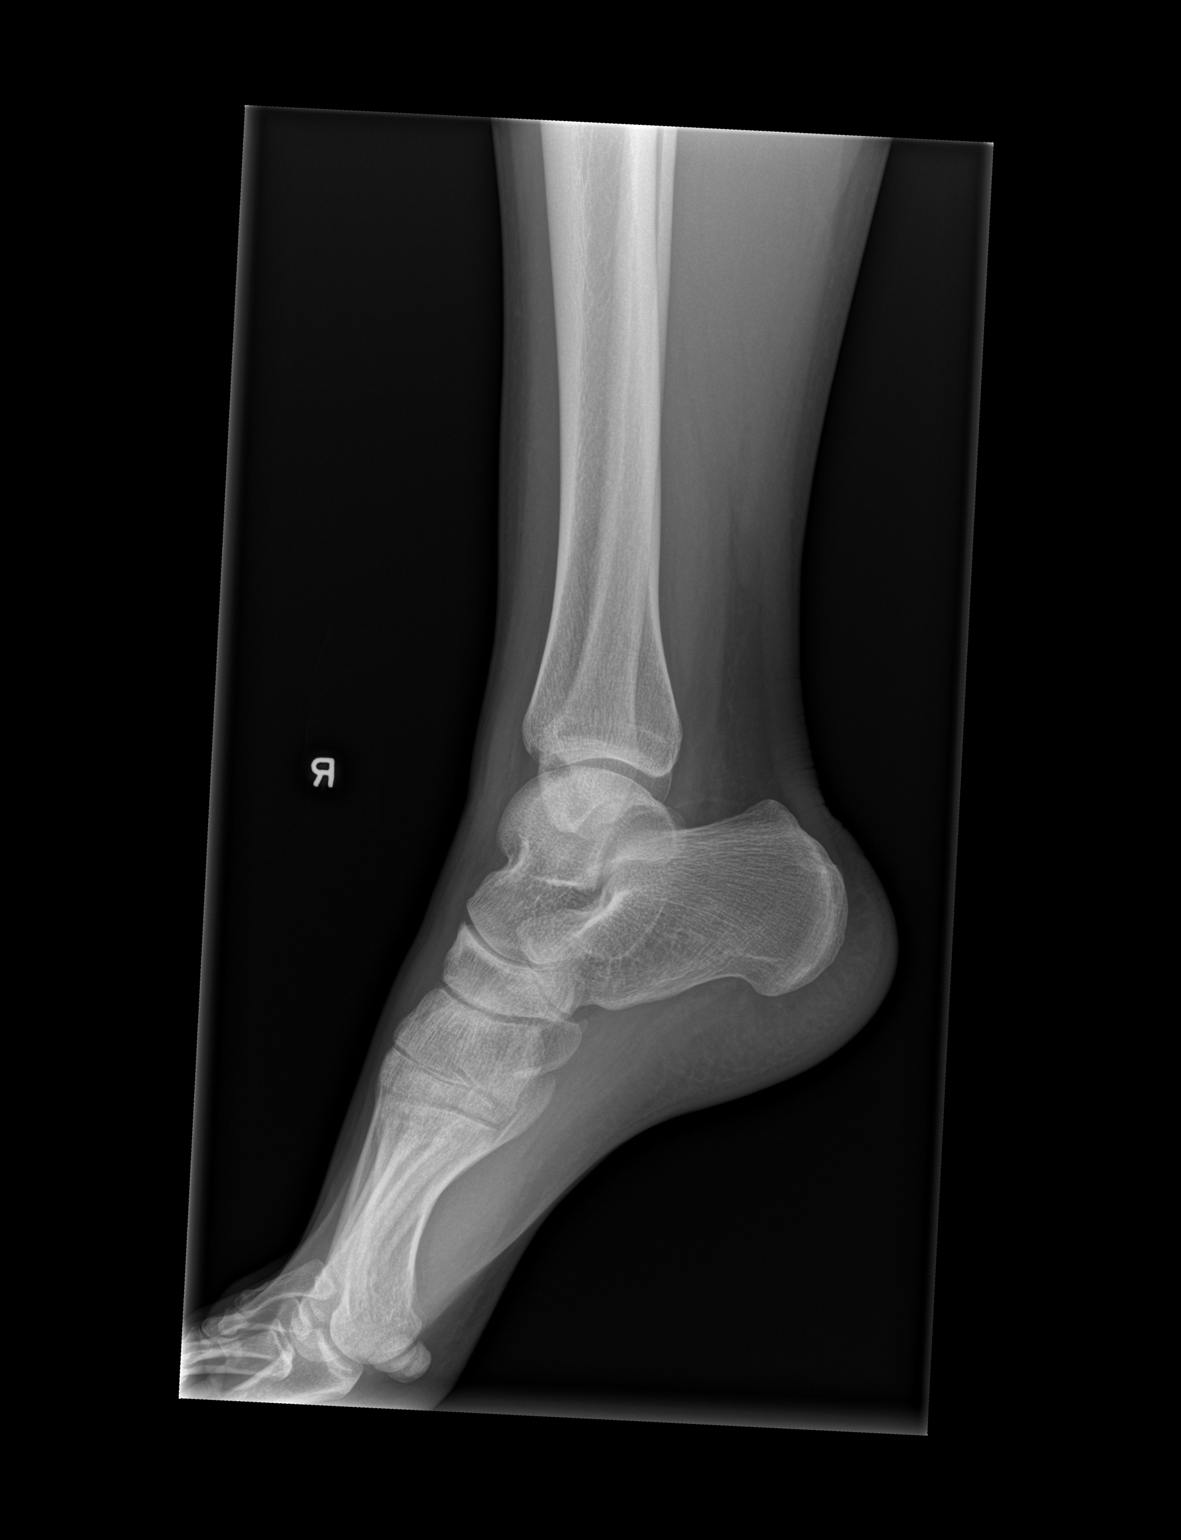

[3 of 3 positions shown; findings below may reference images not displayed]

FINDINGS: There is no evidence of fracture, dislocation, or joint effusion.
IMPRESSION: Negative.

## 2016-10-24 NOTE — Addendum Note (Signed)
Addendum  created 10/24/16 0840 by Laurey Salser, MD   Sign clinical note    

## 2017-03-19 ENCOUNTER — Encounter: Payer: Self-pay | Admitting: Family Medicine

## 2017-03-19 ENCOUNTER — Ambulatory Visit (INDEPENDENT_AMBULATORY_CARE_PROVIDER_SITE_OTHER): Payer: Self-pay | Admitting: Family Medicine

## 2017-03-19 VITALS — BP 127/89 | HR 78 | Wt 141.1 lb

## 2017-03-19 DIAGNOSIS — N61 Mastitis without abscess: Secondary | ICD-10-CM

## 2017-03-19 MED ORDER — SULFAMETHOXAZOLE-TRIMETHOPRIM 800-160 MG PO TABS: 1.0000 | ORAL_TABLET | Freq: Two times a day (BID) | ORAL | 1 refills | Status: DC

## 2017-03-19 NOTE — Progress Notes (Signed)
   Subjective:    Patient ID: Ashley Curtis, female    DOB: Oct 12, 1985, 31 y.o.   MRN: [id number]  HPI Patient seen for area under left breast that was red and sore. Started 3 weeks ago. Appears to be improving. No firmness or drainage. Is currently breastfeeding. No problems with breast-feeding.  Denies fevers, chills, nausea, vomiting.  Area approximately 1 cm in diameter.   Review of Systems     Objective:   Physical Exam  Constitutional: She appears well-developed and well-nourished.  Pulmonary/Chest:            Assessment & Plan:  1. Breast infection in female We will treat with Bactrim twice daily times 7 days.  Patient to return if worsening or not improving.

## 2017-07-25 ENCOUNTER — Ambulatory Visit (HOSPITAL_COMMUNITY)
Admission: EM | Admit: 2017-07-25 | Discharge: 2017-07-25 | Disposition: A | Discharge status: Home / Self Care | Payer: Self-pay | Attending: Urgent Care | Admitting: Urgent Care

## 2017-07-25 ENCOUNTER — Other Ambulatory Visit: Payer: Self-pay

## 2017-07-25 ENCOUNTER — Encounter (HOSPITAL_COMMUNITY): Payer: Self-pay | Admitting: *Deleted

## 2017-07-25 DIAGNOSIS — B9789 Other viral agents as the cause of diseases classified elsewhere: Secondary | ICD-10-CM

## 2017-07-25 DIAGNOSIS — R0981 Nasal congestion: Secondary | ICD-10-CM

## 2017-07-25 DIAGNOSIS — J069 Acute upper respiratory infection, unspecified: Secondary | ICD-10-CM

## 2017-07-25 MED ORDER — BENZONATATE 100 MG PO CAPS
100.0000 mg | ORAL_CAPSULE | Freq: Three times a day (TID) | ORAL | 0 refills | Status: DC | PRN
Start: 2017-07-25 — End: 2023-05-24

## 2017-07-25 MED ORDER — PSEUDOEPHEDRINE HCL ER 120 MG PO TB12: 120.0000 mg | ORAL_TABLET | Freq: Two times a day (BID) | ORAL | 3 refills | Status: DC

## 2017-07-25 NOTE — ED Provider Notes (Signed)
  MRN: [id number] DOB: [birth year]  Subjective:   Dorothey BasemanBarbara F Dubuc is a 32 y.o. female presenting for 3 day history of dry cough, chills, fatigue, frontal sinus headaches, heaviness of her eyes. Patient has sore throat from her cough. Patient's friend tested positive for the flu, pneumonia. Has 33 year old, 33 year old. She is breastfeeding. Denies ear pain, chest pain, shob, n/v, abdominal pain, rashes, dizziness. Denies smoking cigarettes.   Britta MccreedyBarbara has No Known Allergies.  Britta MccreedyBarbara  has a past medical history of Anemia, Anxiety, Gestational diabetes, and Migraine. Also  has a past surgical history that includes Appendectomy; Cesarean section (N/A, 12/04/2012); and Cesarean section (N/A, 06/18/2016). Her family history includes Gestational diabetes in her mother; Seizures in her brother.   Objective:   Vitals: BP 124/85 (BP Location: Right Arm)   Pulse 86   Temp 98 F (36.7 C) (Oral)   SpO2 100%   Breastfeeding? Yes   Physical Exam  Constitutional: She is oriented to person, place, and time. She appears well-developed and well-nourished.  HENT:  TM's intact bilaterally, no effusions or erythema. Nasal turbinates pink, dry, nasal passages patent. Frontal sinus tenderness. Oropharynx with post-nasal drainage, mucous membranes moist.    Eyes: Right eye exhibits no discharge. Left eye exhibits no discharge.  Cardiovascular: Normal rate, regular rhythm and intact distal pulses. Exam reveals no gallop and no friction rub.  No murmur heard. Pulmonary/Chest: No respiratory distress. She has no wheezes. She has no rales.  Neurological: She is alert and oriented to person, place, and time.  Skin: Skin is warm and dry.  Psychiatric: She has a normal mood and affect.   Assessment and Plan :   Viral URI with cough  Sinus congestion  Will start supportive care. Use Tessalon, Sudafed. Return-to-clinic precautions discussed, patient verbalized understanding.    Wallis BambergMani, Petrona Wyeth, PA-C 07/25/17 1453

## 2017-07-25 NOTE — Discharge Instructions (Signed)
Para el dolor de garganta intente usar un t de miel. Use 3 cucharaditas de miel con jugo exprimido de medio limn. Coloque las piezas de jengibre afeitadas en 1/2 - 1 taza de agua y caliente sobre la estufa. Luego mezcle los ingredientes y repita cada 4 horas.  

## 2017-07-25 NOTE — ED Triage Notes (Signed)
Cough, chills, headache, back pain, fatigue,

## 2017-07-30 IMAGING — US US OB TRANSVAGINAL
1 series · 15 of 28 positions shown · non-contrast
Comparison: 10/22/2015

CLINICAL DATA: Abdominal pain during pregnancy. Pregnancy of
unknown anatomic location. Gestational age by LMP of 8 weeks 6 days.

EXAM:
TRANSVAGINAL OB ULTRASOUND
TECHNIQUE: Transvaginal ultrasound was performed for complete evaluation of the
gestation as well as the maternal uterus, adnexal regions, and
pelvic cul-de-sac.

[Series 1: us ob transvaginal · 15 of 28 slices shown]
[im 1/28]
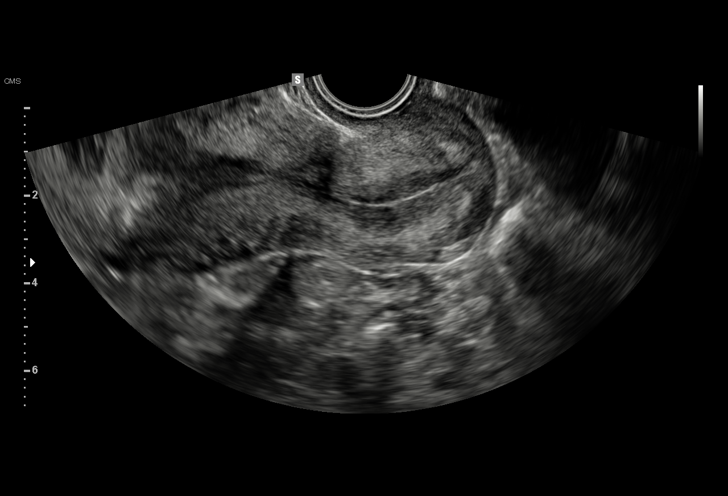
[im 3/28]
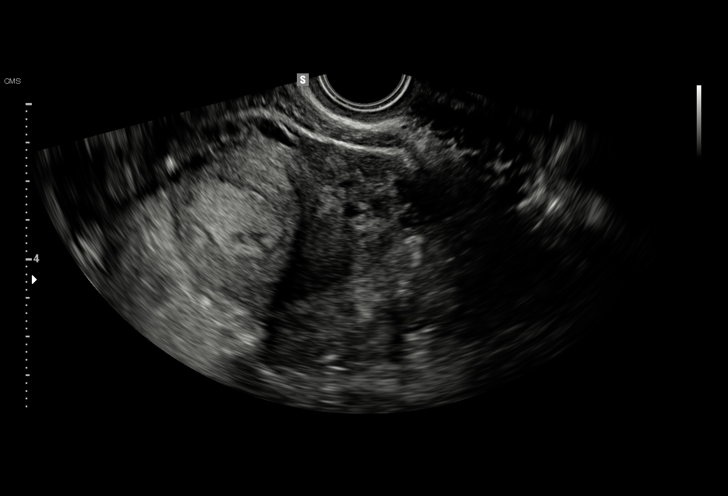
[im 5/28]
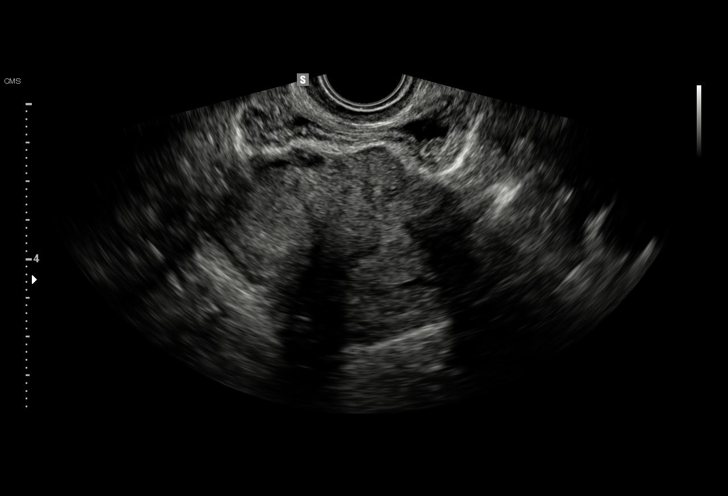
[im 7/28]
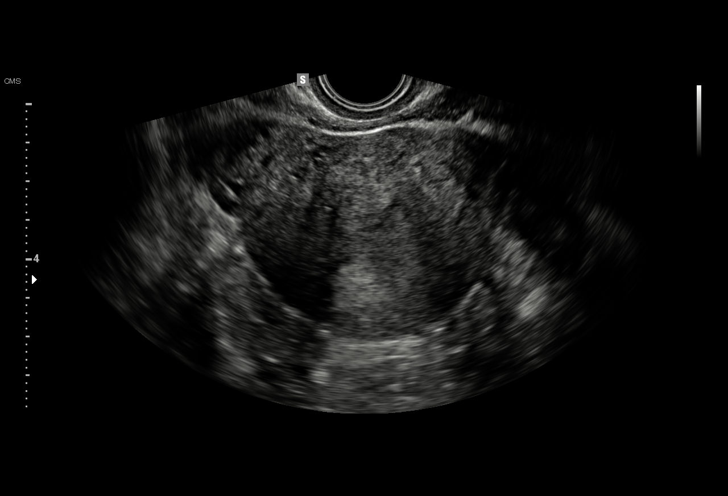
[im 9/28]
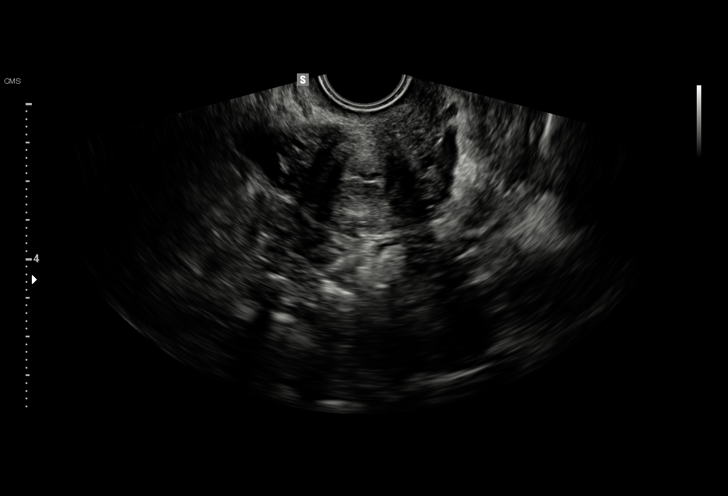
[im 11/28]
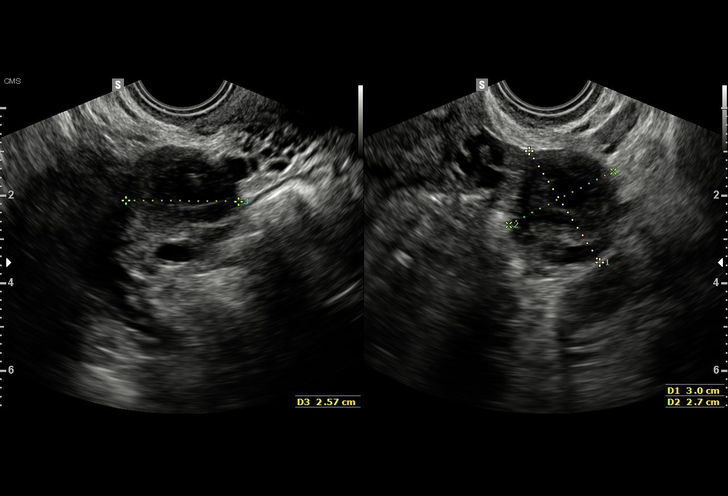
[im 13/28]
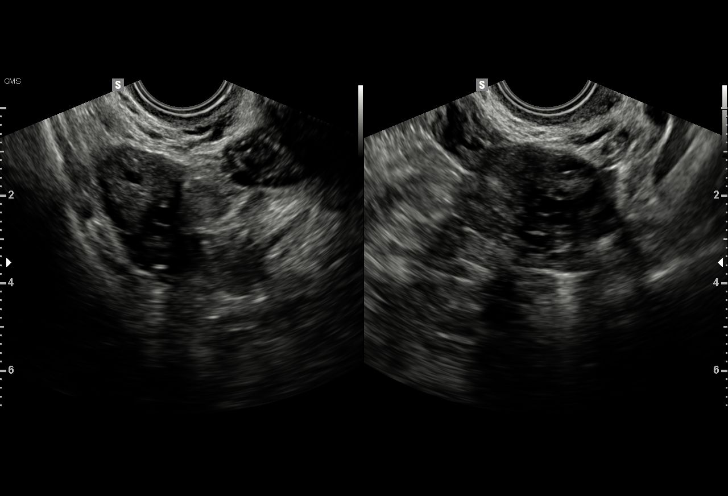
[im 15/28]
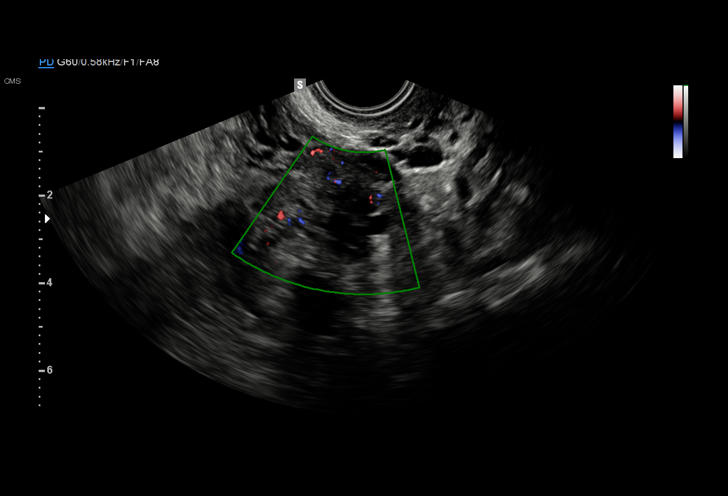
[im 16/28]
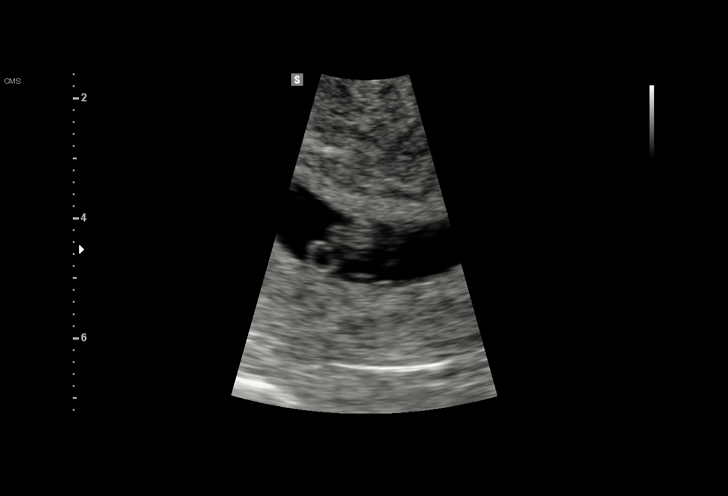
[im 18/28]
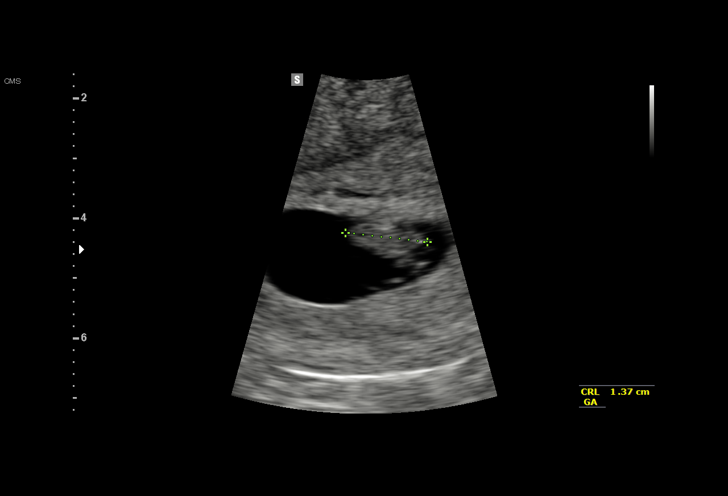
[im 20/28]
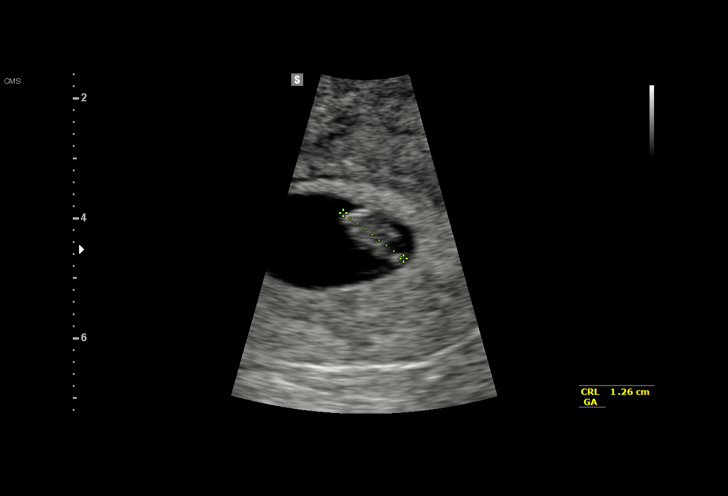
[im 22/28]
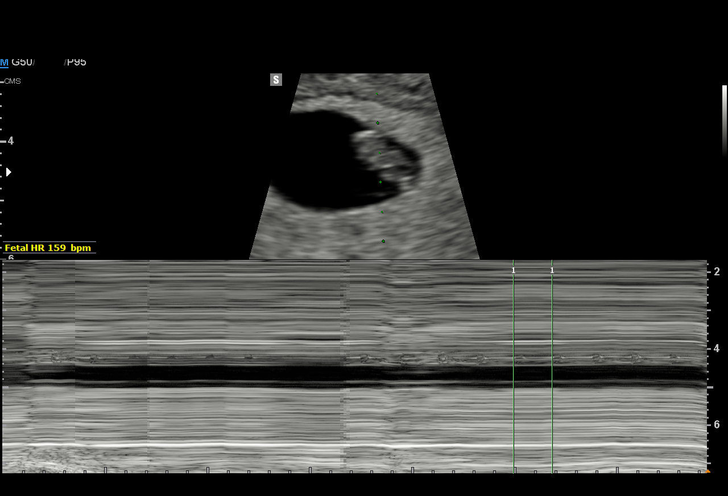
[im 24/28]
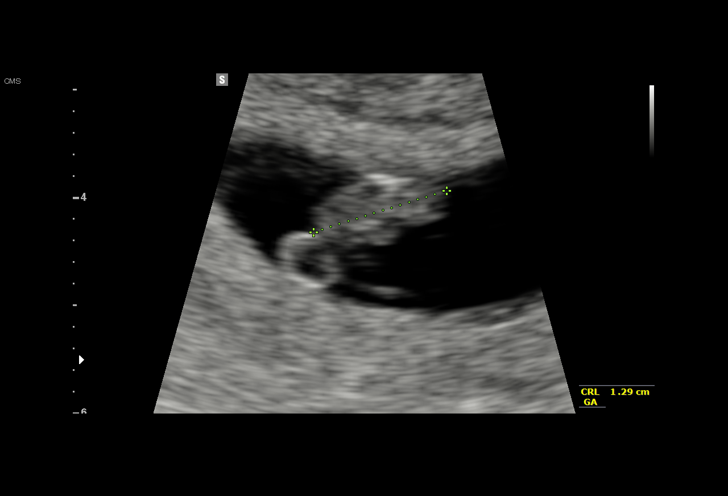
[im 26/28]
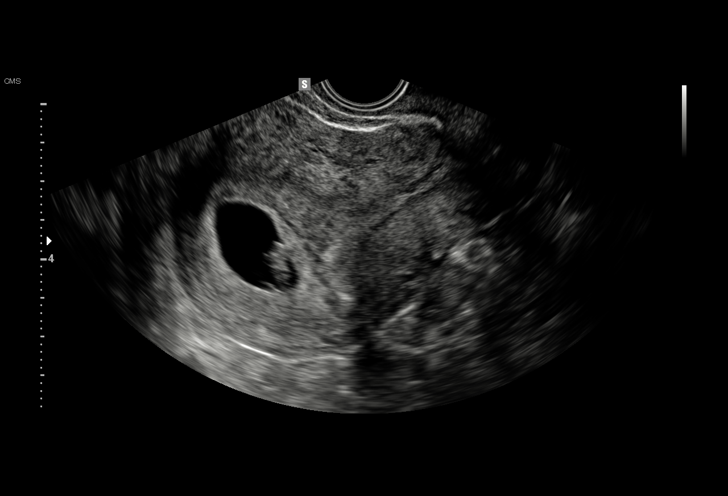
[im 28/28]
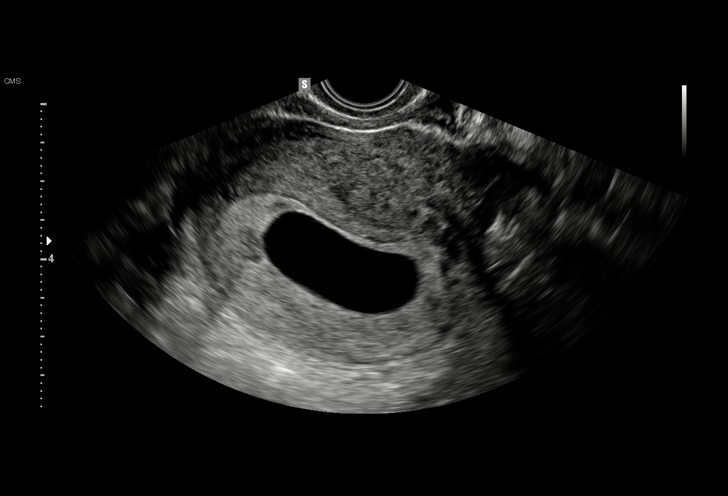

[15 of 28 positions shown; findings below may reference images not displayed]

FINDINGS: Intrauterine gestational sac: Single

Yolk sac:  Visualized

Embryo:  Visualized

Cardiac Activity: Visualized

Heart Rate: 159 bpm

CRL:   13  mm   7 w 4 d                  US EDC: 06/29/2016

Subchorionic hemorrhage:  None visualized.

Maternal uterus/adnexae: Both ovaries are normal in appearance. No
masses or abnormal free fluid visualized.
IMPRESSION: Single living IUP measuring 7 weeks 4 days, with US EDC of
06/29/2016.

No significant maternal uterine or adnexal abnormality identified.

## 2017-10-13 IMAGING — US US MFM OB DETAIL+14 WK
1 series · 14 of 28 positions shown · non-contrast
Comparison: none

[Series 1: us mfm ob detail+14 wk · 87 acquisitions, 14 frames shown]
[im 4/87]
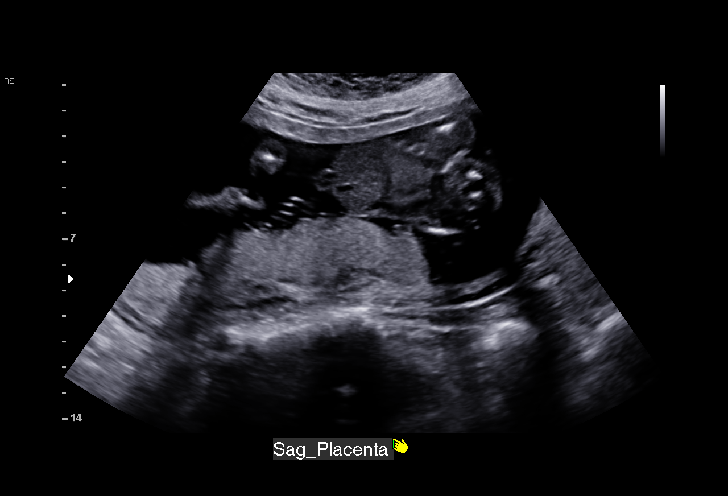
[im 10/87]
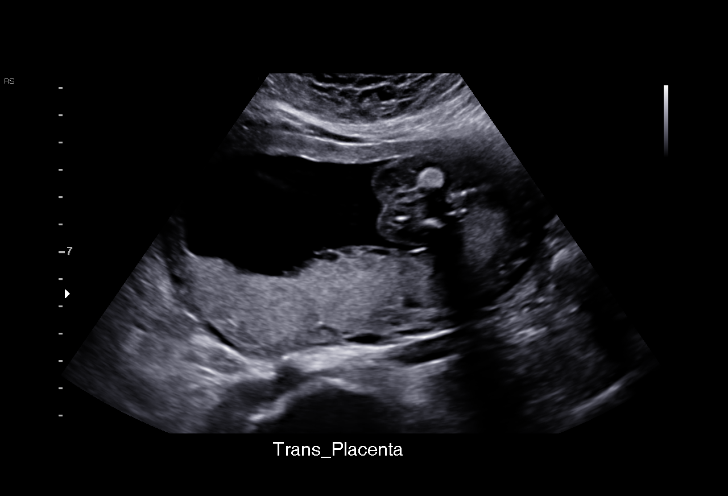
[im 16/87]
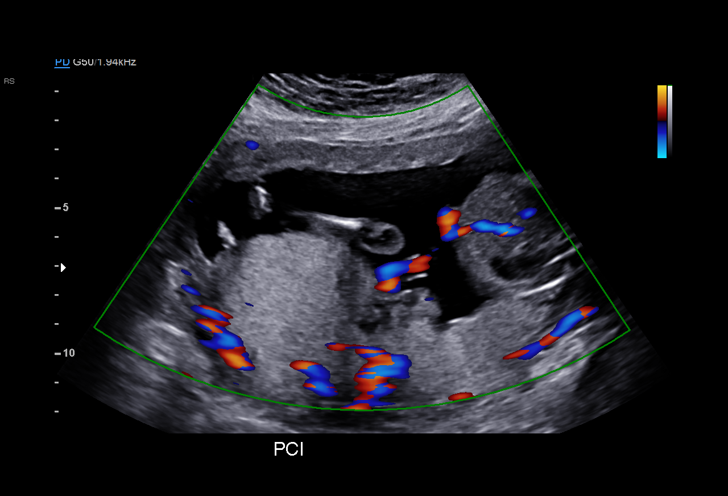
[im 23/87]
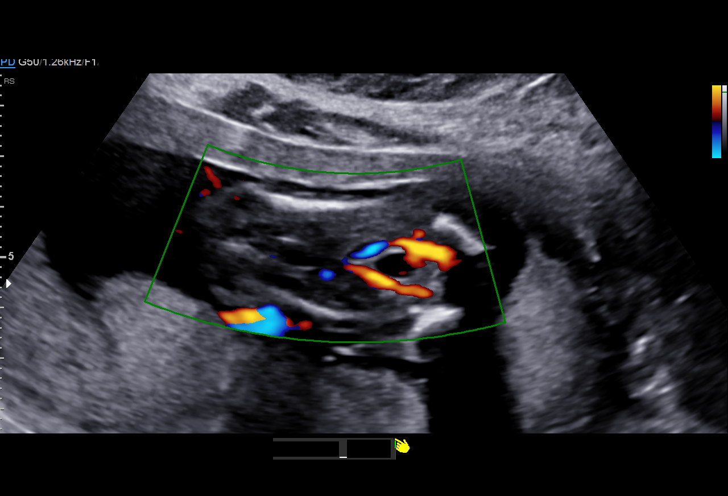
[im 29/87]
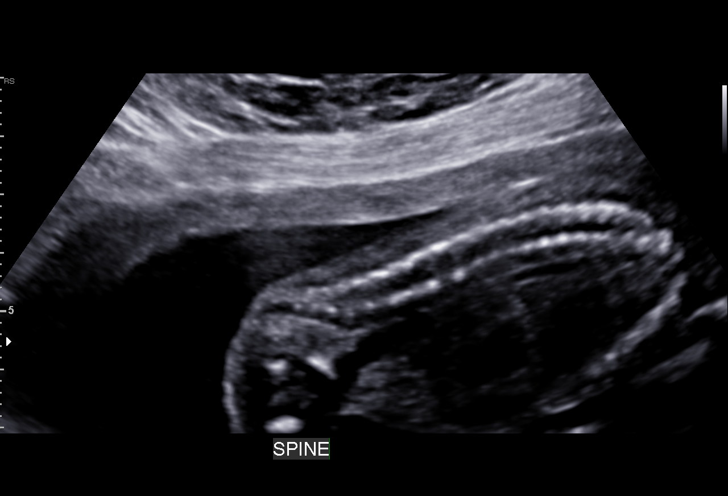
[im 36/87]
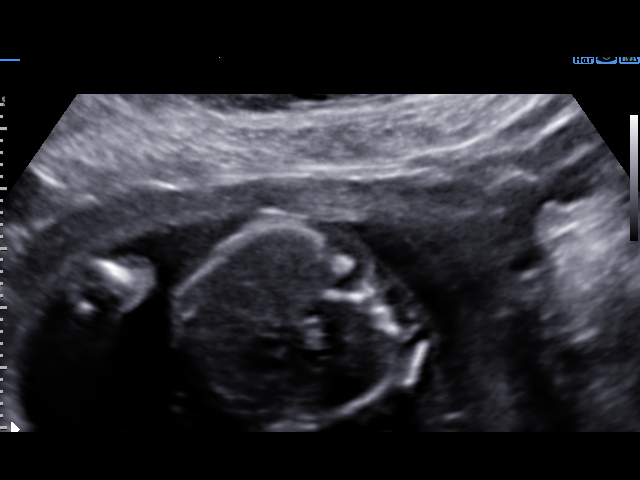
[im 42/87]
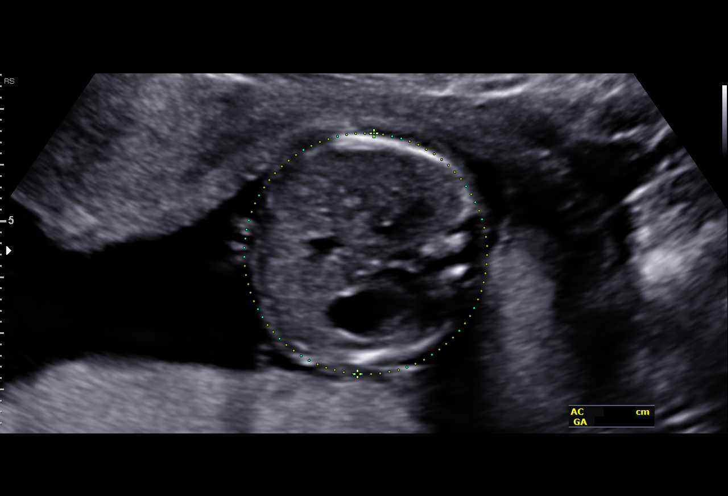
[im 48/87]
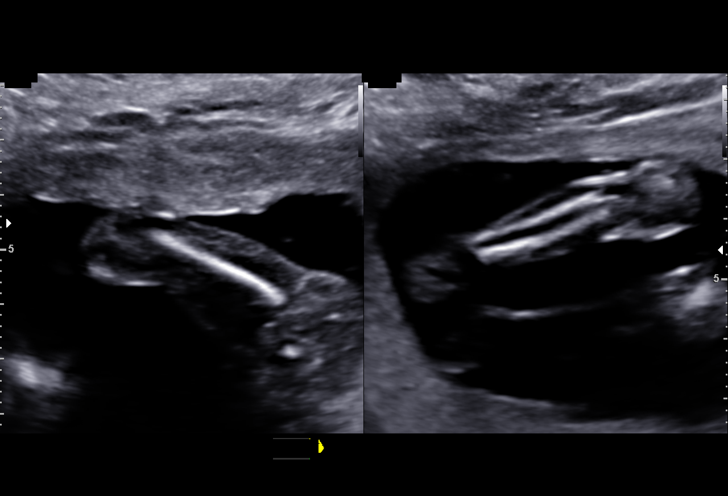
[im 55/87]
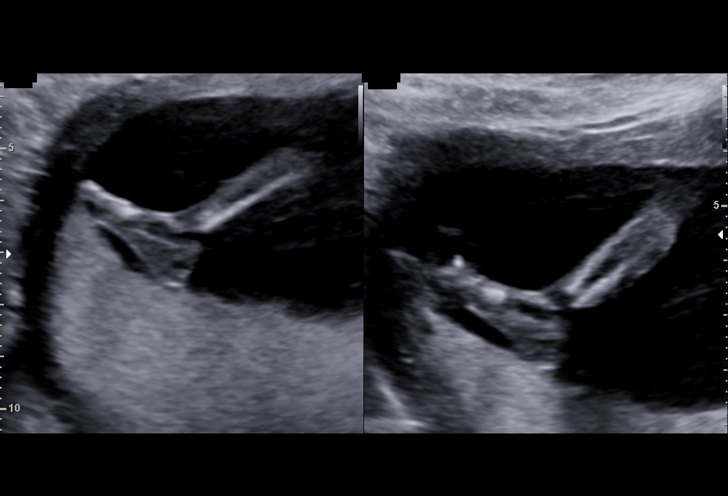
[im 61/87]
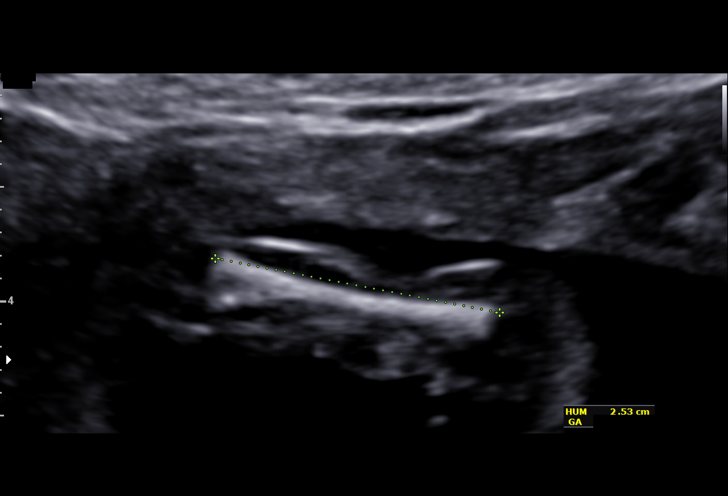
[im 67/87]
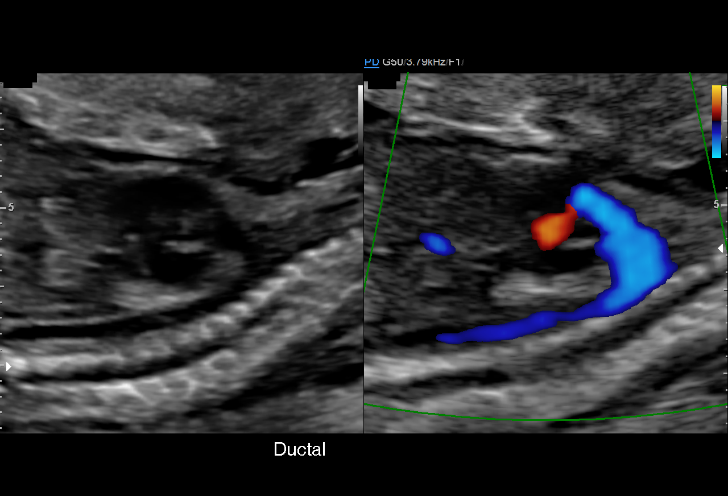
[im 74/87]
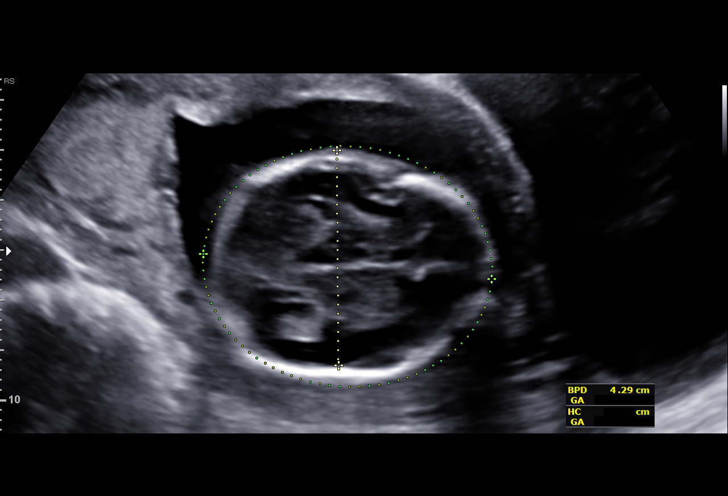
[im 80/87]
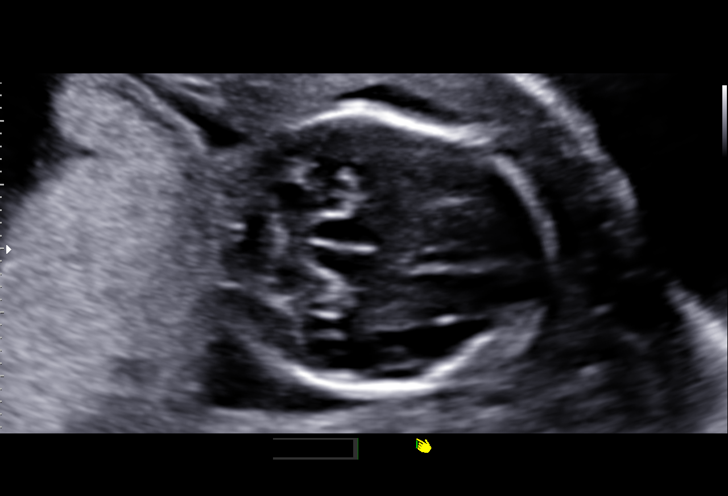
[im 87/87]
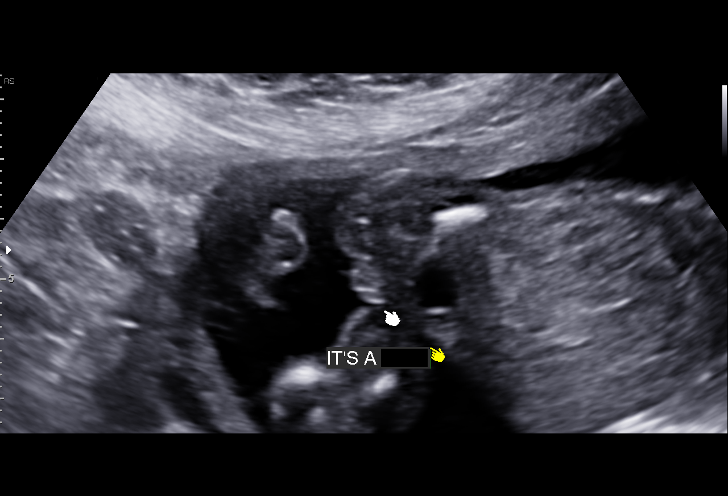

[14 of 28 positions shown; findings below may reference images not displayed]

OB/Gyn Clinic

1  KATHRIN MOHAN           [id number]      [id number]     [id number]
Indications

18 weeks gestation of pregnancy
Poor obstetric history: Previous gestational
diabetes
Previous cesarean delivery, antepartum
Detailed fetal anatomic survey                 Z36
OB History

Gravidity:    2         Term:   1        Prem:   0        SAB:   0
TOP:          0       Ectopic:  0        Living: 1
Fetal Evaluation

Num Of Fetuses:     1
Fetal Heart         148
Rate(bpm):
Cardiac Activity:   Observed
Presentation:       Cephalic
Placenta:           Posterior, above cervical os
P. Cord Insertion:  Visualized, central
Amniotic Fluid
AFI FV:      Subjectively within normal limits

Largest Pocket(cm)
5.46
Biometry

BPD:      42.5  mm     G. Age:  18w 6d         75  %    CI:        67.35   %    70 - 86
FL/HC:      16.7   %    15.8 - 18
HC:      165.8  mm     G. Age:  19w 2d         85  %    HC/AC:      1.20        1.07 -
AC:      138.4  mm     G. Age:  19w 2d         78  %    FL/BPD:     65.2   %
FL:       27.7  mm     G. Age:  18w 3d         51  %    FL/AC:      20.0   %    20 - 24
HUM:      26.1  mm     G. Age:  18w 1d         53  %
CER:      19.9  mm     G. Age:  19w 0d         72  %
NFT:         4  mm
CM:        4.5  mm

Est. FW:     264  gm      0 lb 9 oz     57  %
Gestational Age

U/S Today:     19w 0d                                        EDD:   06/24/16
Best:          18w 2d     Det. By:  Early Ultrasound         EDD:   06/29/16
(11/15/15)
Anatomy

Cranium:               Appears normal         Aortic Arch:            Appears normal
Cavum:                 Not well visualized    Ductal Arch:            Appears normal
Ventricles:            Not well visualized    Diaphragm:              Appears normal
Choroid Plexus:        Appears normal         Stomach:                Appears normal, left
sided
Cerebellum:            Appears normal         Abdomen:                Appears normal
Posterior Fossa:       Appears normal         Abdominal Wall:         Appears nml (cord
insert, abd wall)
Nuchal Fold:           Appears normal         Cord Vessels:           Appears normal (3
vessel cord)
Face:                  Orbits appear          Kidneys:                Appear normal
normal
Lips:                  Not well visualized    Bladder:                Appears normal
Thoracic:              Appears normal         Spine:                  Appears normal
Heart:                 Not well visualized    Upper Extremities:      Appears normal
RVOT:                  Not well visualized    Lower Extremities:      Appears normal
LVOT:                  Appears normal

Other:  Fetus appears to be a female. Heels and left 5th digit visualized.
Technically difficult due to fetal position.
Cervix Uterus Adnexa

Cervix
Length:           4.81  cm.
Normal appearance by transabdominal scan.

Uterus
No abnormality visualized.

Left Ovary
Within normal limits.
Right Ovary
Within normal limits.

Adnexa:       No abnormality visualized. No adnexal mass
visualized.
Impression

SIUP at 99w3d
active fetus
EFW 57th%'le
no defects seen
limited as above
no previa
Recommendations

Interval growth and completion of anatomy in 6 weeks.

## 2018-08-03 ENCOUNTER — Encounter (HOSPITAL_COMMUNITY): Payer: Self-pay

## 2018-08-03 ENCOUNTER — Other Ambulatory Visit: Payer: Self-pay

## 2018-08-03 DIAGNOSIS — R05 Cough: Secondary | ICD-10-CM | POA: Insufficient documentation

## 2018-08-03 DIAGNOSIS — R509 Fever, unspecified: Secondary | ICD-10-CM | POA: Insufficient documentation

## 2018-08-03 DIAGNOSIS — Z5321 Procedure and treatment not carried out due to patient leaving prior to being seen by health care provider: Secondary | ICD-10-CM | POA: Insufficient documentation

## 2018-08-03 NOTE — ED Triage Notes (Signed)
Pt complains of a productive cough and a fever all day today, she states that her kids just got over the flu

## 2018-08-04 ENCOUNTER — Emergency Department (HOSPITAL_COMMUNITY)
Admission: EM | Admit: 2018-08-04 | Discharge: 2018-08-04 | Disposition: A | Discharge status: Home / Self Care | Payer: Self-pay | Attending: Emergency Medicine | Admitting: Emergency Medicine

## 2023-05-24 ENCOUNTER — Ambulatory Visit
Admission: EM | Admit: 2023-05-24 | Discharge: 2023-05-24 | Disposition: A | Discharge status: Home / Self Care | Payer: BC Managed Care – PPO | Attending: Physician Assistant | Admitting: Physician Assistant

## 2023-05-24 DIAGNOSIS — J069 Acute upper respiratory infection, unspecified: Secondary | ICD-10-CM | POA: Diagnosis not present

## 2023-05-24 DIAGNOSIS — R051 Acute cough: Secondary | ICD-10-CM | POA: Diagnosis not present

## 2023-05-24 MED ORDER — PROMETHAZINE-DM 6.25-15 MG/5ML PO SYRP: 5.0000 mL | ORAL_SOLUTION | Freq: Four times a day (QID) | ORAL | 0 refills | Status: AC | PRN

## 2023-05-24 NOTE — ED Triage Notes (Addendum)
Sx x 1 week  Productive cough with green mucus. Cough gets worse at night unable to sleep Sore throat that got better after 1 day Woke up with lower back pian  Taking theraflu and robitussin

## 2023-05-24 NOTE — Discharge Instructions (Addendum)
URI/COLD SYMPTOMS: Your exam today is consistent with a viral illness. Antibiotics are not indicated at this time. Use medications as directed, including cough syrup, nasal saline, and decongestants. Your symptoms should improve over the next few days and resolve within 7-10 days. Increase rest and fluids. F/u if symptoms worsen or predominate such as sore throat, ear pain, productive cough, shortness of breath, or if you develop high fevers or worsening fatigue over the next several days.    If fever, worsening symptoms or not feeling better after another week, return for re-eval.

## 2023-05-24 NOTE — ED Provider Notes (Signed)
[facility]    CSN: [id number] Arrival date & time: 05/24/23  0846      History   Chief Complaint Chief Complaint  Patient presents with   Cough   Back Pain    HPI Ashley Curtis is a 37 y.o. female presenting for 6-day history of cough, congestion, postnasal drainage, fatigue, headaches and difficulty sleeping due to cough.  Patient reports symptoms began with a sore throat with that has resolved.  She says she had some pain in her lower back this morning but she is not really feeling at this time.  Denies fever, chest pain/tightness, wheezing or breathing difficulty.  States she feels like symptoms are getting better but she still bothered by the cough.  Taking Robitussin and TheraFlu.  HPI  Past Medical History:  Diagnosis Date   Anemia    Anxiety    Gestational diabetes    Migraine     Patient Active Problem List   Diagnosis Date Noted   De Quervain's tenosynovitis, right 07/24/2016   H/O severe pre-eclampsia 07/21/2016   Previous cesarean section 07/21/2016   Anxiety    Migraine     Past Surgical History:  Procedure Laterality Date   APPENDECTOMY     6 yoa   CESAREAN SECTION N/A 12/04/2012   Procedure: Primary CESAREAN SECTION of baby girl at 2[address]GAR 9/9;  Surgeon: Esmeralda Arthur, MD;  Location: [facility];  Service: Obstetrics;  Laterality: N/A;   CESAREAN SECTION N/A 06/18/2016   Procedure: CESAREAN SECTION;  Surgeon: Hermina Staggers, MD;  Location: [facility];  Service: Obstetrics;  Laterality: N/A;    OB History     Gravida  2   Para  2   Term  2   Preterm  0   AB  0   Living  2      SAB  0   IAB  0   Ectopic  0   Multiple  0   Live Births  2            Home Medications    Prior to Admission medications   Medication Sig Start Date End Date Taking? Authorizing Provider  promethazine-dextromethorphan (PROMETHAZINE-DM) 6.25-15 MG/5ML syrup Take 5 mLs by mouth 4 (four) times daily as needed. 05/24/23  Yes  Shirlee Latch, PA-C    Family History Family History  Problem Relation Age of Onset   Gestational diabetes Mother    Seizures Brother     Social History Social History   Tobacco Use   Smoking status: Some Days    Current packs/day: 0.00    Types: Cigarettes    Last attempt to quit: 10/28/2015    Years since quitting: 7.5   Smokeless tobacco: Never  Vaping Use   Vaping status: Never Used  Substance Use Topics   Alcohol use: No   Drug use: No     Allergies   Complete allergy medicine [diphenhydramine hcl]   Review of Systems Review of Systems  Constitutional:  Positive for fatigue. Negative for chills, diaphoresis and fever.  HENT:  Positive for congestion and rhinorrhea. Negative for ear pain, sinus pressure, sinus pain and sore throat.   Respiratory:  Positive for cough. Negative for shortness of breath and wheezing.   Cardiovascular:  Negative for chest pain.  Gastrointestinal:  Negative for abdominal pain, nausea and vomiting.  Musculoskeletal:  Positive for back pain.  Skin:  Negative for rash.  Neurological:  Negative for weakness and headaches.  Hematological:  Negative for adenopathy.     Physical Exam Triage Vital Signs ED Triage Vitals [05/24/23 0955]  Encounter Vitals Group     BP      Systolic BP Percentile      Diastolic BP Percentile      Pulse      Resp      Temp      Temp src      SpO2      Weight      Height      Head Circumference      Peak Flow      Pain Score 4     Pain Loc      Pain Education      Exclude from Growth Chart    No data found.  Updated Vital Signs BP 125/88 (BP Location: Right Arm)   Pulse (!) 104   Temp 98.5 F (36.9 C) (Oral)   Resp 19   SpO2 97%   Breastfeeding No   Physical Exam Vitals and nursing note reviewed.  Constitutional:      General: She is not in acute distress.    Appearance: Normal appearance. She is not ill-appearing or toxic-appearing.  HENT:     Head: Normocephalic and atraumatic.      Right Ear: Tympanic membrane, ear canal and external ear normal.     Left Ear: Tympanic membrane, ear canal and external ear normal.     Nose: Congestion present.     Mouth/Throat:     Mouth: Mucous membranes are moist.     Pharynx: Oropharynx is clear.  Eyes:     General: No scleral icterus.       Right eye: No discharge.        Left eye: No discharge.     Conjunctiva/sclera: Conjunctivae normal.  Cardiovascular:     Rate and Rhythm: Regular rhythm. Tachycardia present.     Heart sounds: Normal heart sounds.  Pulmonary:     Effort: Pulmonary effort is normal. No respiratory distress.     Breath sounds: Normal breath sounds.  Musculoskeletal:     Cervical back: Neck supple.  Skin:    General: Skin is dry.  Neurological:     General: No focal deficit present.     Mental Status: She is alert. Mental status is at baseline.     Motor: No weakness.     Gait: Gait normal.  Psychiatric:        Mood and Affect: Mood normal.        Behavior: Behavior normal.      UC Treatments / Results  Labs (all labs ordered are listed, but only abnormal results are displayed) Labs Reviewed - No data to display  EKG   Radiology No results found.  Procedures Procedures (including critical care time)  Medications Ordered in UC Medications - No data to display  Initial Impression / Assessment and Plan / UC Course  I have reviewed the triage vital signs and the nursing notes.  Pertinent labs & imaging results that were available during my care of the patient were reviewed by me and considered in my medical decision making (see chart for details).   37 year old female presents for cough and congestion x 6 days.  Sore throat has resolved.  No associated fever, chest pain/tightness, wheezing or shortness of breath.  Patient is afebrile.  Overall well-appearing.  No acute distress.  On exam has nasal congestion.  Throat is clear.  Chest clear.  Heart regular rhythm.  Advised patient  symptoms likely consistent with viral URI.  Supportive care encouraged with increasing rest and fluids.  I sent Promethazine DM to pharmacy.  Advised to return if she is not feeling better after the next week or if she develops a fever, worsening cough or any breathing difficulty.   Final Clinical Impressions(s) / UC Diagnoses   Final diagnoses:  Viral upper respiratory tract infection  Acute cough     Discharge Instructions      URI/COLD SYMPTOMS: Your exam today is consistent with a viral illness. Antibiotics are not indicated at this time. Use medications as directed, including cough syrup, nasal saline, and decongestants. Your symptoms should improve over the next few days and resolve within 7-10 days. Increase rest and fluids. F/u if symptoms worsen or predominate such as sore throat, ear pain, productive cough, shortness of breath, or if you develop high fevers or worsening fatigue over the next several days.    If fever, worsening symptoms or not feeling better after another week, return for re-eval.     ED Prescriptions     Medication Sig Dispense Auth. Provider   promethazine-dextromethorphan (PROMETHAZINE-DM) 6.25-15 MG/5ML syrup Take 5 mLs by mouth 4 (four) times daily as needed. 118 mL Shirlee Latch, PA-C      PDMP not reviewed this encounter.   Shirlee Latch, PA-C 05/24/23 1036

## 2023-06-17 ENCOUNTER — Other Ambulatory Visit: Payer: BC Managed Care – PPO

## 2023-07-16 ENCOUNTER — Other Ambulatory Visit: Payer: Self-pay | Admitting: Obstetrics and Gynecology

## 2023-07-16 DIAGNOSIS — Z1231 Encounter for screening mammogram for malignant neoplasm of breast: Secondary | ICD-10-CM

## 2023-07-29 ENCOUNTER — Other Ambulatory Visit: Payer: BC Managed Care – PPO

## 2023-08-06 ENCOUNTER — Ambulatory Visit: Admit: 2023-08-06 | Payer: BC Managed Care – PPO | Admitting: Obstetrics and Gynecology

## 2023-08-06 SURGERY — LAPAROSCOPIC BILATERAL SALPINGECTOMY
Anesthesia: General | Laterality: Bilateral

## 2023-08-19 ENCOUNTER — Ambulatory Visit
Admission: RE | Admit: 2023-08-19 | Discharge: 2023-08-19 | Disposition: A | Discharge status: Home / Self Care | Source: Ambulatory Visit | Attending: Obstetrics and Gynecology | Admitting: Obstetrics and Gynecology

## 2023-08-19 ENCOUNTER — Encounter

## 2023-08-19 DIAGNOSIS — Z1231 Encounter for screening mammogram for malignant neoplasm of breast: Secondary | ICD-10-CM | POA: Diagnosis present

## 2023-08-23 ENCOUNTER — Encounter: Payer: Self-pay | Admitting: Certified Nurse Midwife

## 2023-08-25 ENCOUNTER — Other Ambulatory Visit: Payer: Self-pay | Admitting: Obstetrics and Gynecology

## 2023-08-25 DIAGNOSIS — R928 Other abnormal and inconclusive findings on diagnostic imaging of breast: Secondary | ICD-10-CM

## 2023-09-03 ENCOUNTER — Other Ambulatory Visit

## 2023-09-03 ENCOUNTER — Encounter
